# Patient Record
Sex: Male | Born: 1935
Health system: Southern US, Community
[De-identification: ages and names within clinical notes are randomized; demographics above are authoritative.]

## PROBLEM LIST (undated history)

## (undated) DIAGNOSIS — I4891 Unspecified atrial fibrillation: Secondary | ICD-10-CM

## (undated) DIAGNOSIS — I2699 Other pulmonary embolism without acute cor pulmonale: Secondary | ICD-10-CM

## (undated) DIAGNOSIS — J439 Emphysema, unspecified: Secondary | ICD-10-CM

## (undated) DIAGNOSIS — I7101 Dissection of thoracic aorta: Secondary | ICD-10-CM

## (undated) DIAGNOSIS — R911 Solitary pulmonary nodule: Secondary | ICD-10-CM

## (undated) DIAGNOSIS — Z860100 Personal history of colon polyps, unspecified: Secondary | ICD-10-CM

## (undated) DIAGNOSIS — E039 Hypothyroidism, unspecified: Secondary | ICD-10-CM

## (undated) DIAGNOSIS — I71019 Dissection of thoracic aorta, unspecified: Secondary | ICD-10-CM

## (undated) DIAGNOSIS — K2270 Barrett's esophagus without dysplasia: Secondary | ICD-10-CM

## (undated) DIAGNOSIS — I2510 Atherosclerotic heart disease of native coronary artery without angina pectoris: Secondary | ICD-10-CM

## (undated) HISTORY — PX: COLONOSCOPY WITH ESOPHAGOGASTRODUODENOSCOPY (EGD): SHX5779

## (undated) HISTORY — PX: INNER EAR SURGERY: SHX679

## (undated) HISTORY — PX: INGUINAL HERNIA REPAIR: SUR1180

---

## 2008-10-05 HISTORY — PX: LEFT HEART CATH: SHX5946

## 2010-10-05 DIAGNOSIS — I2699 Other pulmonary embolism without acute cor pulmonale: Secondary | ICD-10-CM

## 2010-10-05 HISTORY — DX: Other pulmonary embolism without acute cor pulmonale: I26.99

## 2015-02-15 DIAGNOSIS — Z79899 Other long term (current) drug therapy: Secondary | ICD-10-CM | POA: Diagnosis not present

## 2015-02-15 DIAGNOSIS — Z1389 Encounter for screening for other disorder: Secondary | ICD-10-CM | POA: Diagnosis not present

## 2015-02-15 DIAGNOSIS — E039 Hypothyroidism, unspecified: Secondary | ICD-10-CM | POA: Diagnosis not present

## 2015-02-15 DIAGNOSIS — Z9181 History of falling: Secondary | ICD-10-CM | POA: Diagnosis not present

## 2015-02-15 DIAGNOSIS — E785 Hyperlipidemia, unspecified: Secondary | ICD-10-CM | POA: Diagnosis not present

## 2015-02-15 DIAGNOSIS — I2510 Atherosclerotic heart disease of native coronary artery without angina pectoris: Secondary | ICD-10-CM | POA: Diagnosis not present

## 2015-02-15 DIAGNOSIS — I4891 Unspecified atrial fibrillation: Secondary | ICD-10-CM | POA: Diagnosis not present

## 2015-04-18 DIAGNOSIS — Z6828 Body mass index (BMI) 28.0-28.9, adult: Secondary | ICD-10-CM | POA: Diagnosis not present

## 2015-04-18 DIAGNOSIS — J019 Acute sinusitis, unspecified: Secondary | ICD-10-CM | POA: Diagnosis not present

## 2015-04-18 DIAGNOSIS — J208 Acute bronchitis due to other specified organisms: Secondary | ICD-10-CM | POA: Diagnosis not present

## 2015-05-08 DIAGNOSIS — Z6827 Body mass index (BMI) 27.0-27.9, adult: Secondary | ICD-10-CM | POA: Diagnosis not present

## 2015-05-08 DIAGNOSIS — H1032 Unspecified acute conjunctivitis, left eye: Secondary | ICD-10-CM | POA: Diagnosis not present

## 2015-05-08 DIAGNOSIS — H00015 Hordeolum externum left lower eyelid: Secondary | ICD-10-CM | POA: Diagnosis not present

## 2015-07-01 DIAGNOSIS — I4891 Unspecified atrial fibrillation: Secondary | ICD-10-CM | POA: Diagnosis not present

## 2015-07-01 DIAGNOSIS — Z6827 Body mass index (BMI) 27.0-27.9, adult: Secondary | ICD-10-CM | POA: Diagnosis not present

## 2015-07-01 DIAGNOSIS — N529 Male erectile dysfunction, unspecified: Secondary | ICD-10-CM | POA: Diagnosis not present

## 2015-07-01 DIAGNOSIS — N401 Enlarged prostate with lower urinary tract symptoms: Secondary | ICD-10-CM | POA: Diagnosis not present

## 2015-07-09 DIAGNOSIS — H524 Presbyopia: Secondary | ICD-10-CM | POA: Diagnosis not present

## 2015-07-09 DIAGNOSIS — H2513 Age-related nuclear cataract, bilateral: Secondary | ICD-10-CM | POA: Diagnosis not present

## 2015-07-09 DIAGNOSIS — H5203 Hypermetropia, bilateral: Secondary | ICD-10-CM | POA: Diagnosis not present

## 2015-07-09 DIAGNOSIS — H52223 Regular astigmatism, bilateral: Secondary | ICD-10-CM | POA: Diagnosis not present

## 2015-08-19 DIAGNOSIS — E785 Hyperlipidemia, unspecified: Secondary | ICD-10-CM | POA: Diagnosis not present

## 2015-08-19 DIAGNOSIS — I2510 Atherosclerotic heart disease of native coronary artery without angina pectoris: Secondary | ICD-10-CM | POA: Diagnosis not present

## 2015-08-19 DIAGNOSIS — E039 Hypothyroidism, unspecified: Secondary | ICD-10-CM | POA: Diagnosis not present

## 2015-08-19 DIAGNOSIS — I4891 Unspecified atrial fibrillation: Secondary | ICD-10-CM | POA: Diagnosis not present

## 2015-08-19 DIAGNOSIS — Z23 Encounter for immunization: Secondary | ICD-10-CM | POA: Diagnosis not present

## 2015-08-22 DIAGNOSIS — E785 Hyperlipidemia, unspecified: Secondary | ICD-10-CM | POA: Diagnosis not present

## 2015-08-22 DIAGNOSIS — Z79899 Other long term (current) drug therapy: Secondary | ICD-10-CM | POA: Diagnosis not present

## 2015-08-22 DIAGNOSIS — E039 Hypothyroidism, unspecified: Secondary | ICD-10-CM | POA: Diagnosis not present

## 2015-08-22 DIAGNOSIS — Z125 Encounter for screening for malignant neoplasm of prostate: Secondary | ICD-10-CM | POA: Diagnosis not present

## 2015-10-17 DIAGNOSIS — K219 Gastro-esophageal reflux disease without esophagitis: Secondary | ICD-10-CM | POA: Diagnosis not present

## 2015-11-21 DIAGNOSIS — N410 Acute prostatitis: Secondary | ICD-10-CM | POA: Diagnosis not present

## 2015-11-21 DIAGNOSIS — J441 Chronic obstructive pulmonary disease with (acute) exacerbation: Secondary | ICD-10-CM | POA: Diagnosis not present

## 2015-11-21 DIAGNOSIS — K5792 Diverticulitis of intestine, part unspecified, without perforation or abscess without bleeding: Secondary | ICD-10-CM | POA: Diagnosis not present

## 2015-11-25 DIAGNOSIS — K219 Gastro-esophageal reflux disease without esophagitis: Secondary | ICD-10-CM | POA: Diagnosis not present

## 2015-12-03 DIAGNOSIS — H25813 Combined forms of age-related cataract, bilateral: Secondary | ICD-10-CM | POA: Diagnosis not present

## 2016-01-31 DIAGNOSIS — H40033 Anatomical narrow angle, bilateral: Secondary | ICD-10-CM | POA: Diagnosis not present

## 2016-01-31 DIAGNOSIS — H25811 Combined forms of age-related cataract, right eye: Secondary | ICD-10-CM | POA: Diagnosis not present

## 2016-01-31 DIAGNOSIS — H25812 Combined forms of age-related cataract, left eye: Secondary | ICD-10-CM | POA: Diagnosis not present

## 2016-01-31 DIAGNOSIS — H52223 Regular astigmatism, bilateral: Secondary | ICD-10-CM | POA: Diagnosis not present

## 2016-01-31 DIAGNOSIS — H04123 Dry eye syndrome of bilateral lacrimal glands: Secondary | ICD-10-CM | POA: Diagnosis not present

## 2016-02-19 DIAGNOSIS — E039 Hypothyroidism, unspecified: Secondary | ICD-10-CM | POA: Diagnosis not present

## 2016-02-19 DIAGNOSIS — I4891 Unspecified atrial fibrillation: Secondary | ICD-10-CM | POA: Diagnosis not present

## 2016-02-19 DIAGNOSIS — E785 Hyperlipidemia, unspecified: Secondary | ICD-10-CM | POA: Diagnosis not present

## 2016-02-19 DIAGNOSIS — I2510 Atherosclerotic heart disease of native coronary artery without angina pectoris: Secondary | ICD-10-CM | POA: Diagnosis not present

## 2016-02-19 DIAGNOSIS — Z79899 Other long term (current) drug therapy: Secondary | ICD-10-CM | POA: Diagnosis not present

## 2016-02-19 DIAGNOSIS — Z1389 Encounter for screening for other disorder: Secondary | ICD-10-CM | POA: Diagnosis not present

## 2016-03-03 DIAGNOSIS — Z7982 Long term (current) use of aspirin: Secondary | ICD-10-CM | POA: Diagnosis not present

## 2016-03-03 DIAGNOSIS — H25811 Combined forms of age-related cataract, right eye: Secondary | ICD-10-CM | POA: Diagnosis not present

## 2016-03-03 DIAGNOSIS — G629 Polyneuropathy, unspecified: Secondary | ICD-10-CM | POA: Diagnosis not present

## 2016-03-03 DIAGNOSIS — J449 Chronic obstructive pulmonary disease, unspecified: Secondary | ICD-10-CM | POA: Diagnosis not present

## 2016-03-03 DIAGNOSIS — Z955 Presence of coronary angioplasty implant and graft: Secondary | ICD-10-CM | POA: Diagnosis not present

## 2016-03-03 DIAGNOSIS — I2510 Atherosclerotic heart disease of native coronary artery without angina pectoris: Secondary | ICD-10-CM | POA: Diagnosis not present

## 2016-03-03 DIAGNOSIS — E039 Hypothyroidism, unspecified: Secondary | ICD-10-CM | POA: Diagnosis not present

## 2016-03-03 DIAGNOSIS — K219 Gastro-esophageal reflux disease without esophagitis: Secondary | ICD-10-CM | POA: Diagnosis not present

## 2016-03-03 DIAGNOSIS — Z8679 Personal history of other diseases of the circulatory system: Secondary | ICD-10-CM | POA: Diagnosis not present

## 2016-03-03 DIAGNOSIS — Z87891 Personal history of nicotine dependence: Secondary | ICD-10-CM | POA: Diagnosis not present

## 2016-03-03 DIAGNOSIS — H9190 Unspecified hearing loss, unspecified ear: Secondary | ICD-10-CM | POA: Diagnosis not present

## 2016-03-03 DIAGNOSIS — Z79899 Other long term (current) drug therapy: Secondary | ICD-10-CM | POA: Diagnosis not present

## 2016-03-03 DIAGNOSIS — I739 Peripheral vascular disease, unspecified: Secondary | ICD-10-CM | POA: Diagnosis not present

## 2016-03-03 DIAGNOSIS — N400 Enlarged prostate without lower urinary tract symptoms: Secondary | ICD-10-CM | POA: Diagnosis not present

## 2016-03-03 DIAGNOSIS — I509 Heart failure, unspecified: Secondary | ICD-10-CM | POA: Diagnosis not present

## 2016-04-04 HISTORY — PX: CATARACT EXTRACTION, BILATERAL: SHX1313

## 2016-04-14 DIAGNOSIS — H9193 Unspecified hearing loss, bilateral: Secondary | ICD-10-CM | POA: Diagnosis not present

## 2016-04-14 DIAGNOSIS — J449 Chronic obstructive pulmonary disease, unspecified: Secondary | ICD-10-CM | POA: Diagnosis not present

## 2016-04-14 DIAGNOSIS — Z955 Presence of coronary angioplasty implant and graft: Secondary | ICD-10-CM | POA: Diagnosis not present

## 2016-04-14 DIAGNOSIS — G629 Polyneuropathy, unspecified: Secondary | ICD-10-CM | POA: Diagnosis not present

## 2016-04-14 DIAGNOSIS — N400 Enlarged prostate without lower urinary tract symptoms: Secondary | ICD-10-CM | POA: Diagnosis not present

## 2016-04-14 DIAGNOSIS — Z7982 Long term (current) use of aspirin: Secondary | ICD-10-CM | POA: Diagnosis not present

## 2016-04-14 DIAGNOSIS — I4891 Unspecified atrial fibrillation: Secondary | ICD-10-CM | POA: Diagnosis not present

## 2016-04-14 DIAGNOSIS — I2510 Atherosclerotic heart disease of native coronary artery without angina pectoris: Secondary | ICD-10-CM | POA: Diagnosis not present

## 2016-04-14 DIAGNOSIS — H25812 Combined forms of age-related cataract, left eye: Secondary | ICD-10-CM | POA: Diagnosis not present

## 2016-04-14 DIAGNOSIS — I739 Peripheral vascular disease, unspecified: Secondary | ICD-10-CM | POA: Diagnosis not present

## 2016-04-14 DIAGNOSIS — K219 Gastro-esophageal reflux disease without esophagitis: Secondary | ICD-10-CM | POA: Diagnosis not present

## 2016-04-14 DIAGNOSIS — Z79899 Other long term (current) drug therapy: Secondary | ICD-10-CM | POA: Diagnosis not present

## 2016-04-14 DIAGNOSIS — E039 Hypothyroidism, unspecified: Secondary | ICD-10-CM | POA: Diagnosis not present

## 2016-04-14 DIAGNOSIS — Z87891 Personal history of nicotine dependence: Secondary | ICD-10-CM | POA: Diagnosis not present

## 2016-05-11 DIAGNOSIS — M12811 Other specific arthropathies, not elsewhere classified, right shoulder: Secondary | ICD-10-CM | POA: Diagnosis not present

## 2016-05-21 DIAGNOSIS — M25511 Pain in right shoulder: Secondary | ICD-10-CM | POA: Diagnosis not present

## 2016-05-25 DIAGNOSIS — S46811A Strain of other muscles, fascia and tendons at shoulder and upper arm level, right arm, initial encounter: Secondary | ICD-10-CM | POA: Diagnosis not present

## 2016-05-25 DIAGNOSIS — M75121 Complete rotator cuff tear or rupture of right shoulder, not specified as traumatic: Secondary | ICD-10-CM | POA: Diagnosis not present

## 2016-05-27 DIAGNOSIS — I700 Atherosclerosis of aorta: Secondary | ICD-10-CM | POA: Diagnosis not present

## 2016-05-27 DIAGNOSIS — I4891 Unspecified atrial fibrillation: Secondary | ICD-10-CM | POA: Diagnosis not present

## 2016-05-27 DIAGNOSIS — M75121 Complete rotator cuff tear or rupture of right shoulder, not specified as traumatic: Secondary | ICD-10-CM | POA: Diagnosis not present

## 2016-05-27 DIAGNOSIS — I2510 Atherosclerotic heart disease of native coronary artery without angina pectoris: Secondary | ICD-10-CM | POA: Diagnosis not present

## 2016-05-27 DIAGNOSIS — J60 Coalworker's pneumoconiosis: Secondary | ICD-10-CM | POA: Diagnosis not present

## 2016-05-27 DIAGNOSIS — J449 Chronic obstructive pulmonary disease, unspecified: Secondary | ICD-10-CM | POA: Diagnosis not present

## 2016-06-02 DIAGNOSIS — J449 Chronic obstructive pulmonary disease, unspecified: Secondary | ICD-10-CM | POA: Diagnosis not present

## 2016-06-18 DIAGNOSIS — I2510 Atherosclerotic heart disease of native coronary artery without angina pectoris: Secondary | ICD-10-CM | POA: Diagnosis not present

## 2016-06-18 DIAGNOSIS — I4891 Unspecified atrial fibrillation: Secondary | ICD-10-CM | POA: Diagnosis not present

## 2016-06-18 DIAGNOSIS — Z01810 Encounter for preprocedural cardiovascular examination: Secondary | ICD-10-CM | POA: Diagnosis not present

## 2016-06-23 DIAGNOSIS — I2510 Atherosclerotic heart disease of native coronary artery without angina pectoris: Secondary | ICD-10-CM | POA: Diagnosis not present

## 2016-06-23 DIAGNOSIS — I4891 Unspecified atrial fibrillation: Secondary | ICD-10-CM | POA: Diagnosis not present

## 2016-06-23 DIAGNOSIS — Z01810 Encounter for preprocedural cardiovascular examination: Secondary | ICD-10-CM | POA: Diagnosis not present

## 2016-06-25 DIAGNOSIS — M25511 Pain in right shoulder: Secondary | ICD-10-CM | POA: Diagnosis not present

## 2016-06-25 DIAGNOSIS — M75121 Complete rotator cuff tear or rupture of right shoulder, not specified as traumatic: Secondary | ICD-10-CM | POA: Diagnosis not present

## 2016-06-30 DIAGNOSIS — M75121 Complete rotator cuff tear or rupture of right shoulder, not specified as traumatic: Secondary | ICD-10-CM | POA: Diagnosis not present

## 2016-06-30 DIAGNOSIS — M7551 Bursitis of right shoulder: Secondary | ICD-10-CM | POA: Diagnosis not present

## 2016-06-30 DIAGNOSIS — I4891 Unspecified atrial fibrillation: Secondary | ICD-10-CM | POA: Diagnosis not present

## 2016-06-30 DIAGNOSIS — H9190 Unspecified hearing loss, unspecified ear: Secondary | ICD-10-CM | POA: Diagnosis not present

## 2016-06-30 DIAGNOSIS — I2510 Atherosclerotic heart disease of native coronary artery without angina pectoris: Secondary | ICD-10-CM | POA: Diagnosis not present

## 2016-06-30 DIAGNOSIS — J449 Chronic obstructive pulmonary disease, unspecified: Secondary | ICD-10-CM | POA: Diagnosis not present

## 2016-06-30 DIAGNOSIS — E785 Hyperlipidemia, unspecified: Secondary | ICD-10-CM | POA: Diagnosis not present

## 2016-06-30 DIAGNOSIS — M66812 Spontaneous rupture of other tendons, left shoulder: Secondary | ICD-10-CM | POA: Diagnosis not present

## 2016-06-30 DIAGNOSIS — N400 Enlarged prostate without lower urinary tract symptoms: Secondary | ICD-10-CM | POA: Diagnosis not present

## 2016-06-30 DIAGNOSIS — G8918 Other acute postprocedural pain: Secondary | ICD-10-CM | POA: Diagnosis not present

## 2016-06-30 DIAGNOSIS — I10 Essential (primary) hypertension: Secondary | ICD-10-CM | POA: Diagnosis not present

## 2016-06-30 DIAGNOSIS — M7552 Bursitis of left shoulder: Secondary | ICD-10-CM | POA: Diagnosis not present

## 2016-06-30 DIAGNOSIS — Z87891 Personal history of nicotine dependence: Secondary | ICD-10-CM | POA: Diagnosis not present

## 2016-06-30 DIAGNOSIS — K219 Gastro-esophageal reflux disease without esophagitis: Secondary | ICD-10-CM | POA: Diagnosis not present

## 2016-06-30 DIAGNOSIS — M25511 Pain in right shoulder: Secondary | ICD-10-CM | POA: Diagnosis not present

## 2016-06-30 DIAGNOSIS — Z7982 Long term (current) use of aspirin: Secondary | ICD-10-CM | POA: Diagnosis not present

## 2016-06-30 DIAGNOSIS — Z79899 Other long term (current) drug therapy: Secondary | ICD-10-CM | POA: Diagnosis not present

## 2016-06-30 DIAGNOSIS — J309 Allergic rhinitis, unspecified: Secondary | ICD-10-CM | POA: Diagnosis not present

## 2016-06-30 HISTORY — PX: OTHER SURGICAL HISTORY: SHX169

## 2016-08-12 DIAGNOSIS — M6281 Muscle weakness (generalized): Secondary | ICD-10-CM | POA: Diagnosis not present

## 2016-08-12 DIAGNOSIS — M75121 Complete rotator cuff tear or rupture of right shoulder, not specified as traumatic: Secondary | ICD-10-CM | POA: Diagnosis not present

## 2016-08-12 DIAGNOSIS — M25611 Stiffness of right shoulder, not elsewhere classified: Secondary | ICD-10-CM | POA: Diagnosis not present

## 2016-08-12 DIAGNOSIS — M25511 Pain in right shoulder: Secondary | ICD-10-CM | POA: Diagnosis not present

## 2016-08-18 DIAGNOSIS — M25611 Stiffness of right shoulder, not elsewhere classified: Secondary | ICD-10-CM | POA: Diagnosis not present

## 2016-08-18 DIAGNOSIS — M25511 Pain in right shoulder: Secondary | ICD-10-CM | POA: Diagnosis not present

## 2016-08-18 DIAGNOSIS — M6281 Muscle weakness (generalized): Secondary | ICD-10-CM | POA: Diagnosis not present

## 2016-08-18 DIAGNOSIS — M75121 Complete rotator cuff tear or rupture of right shoulder, not specified as traumatic: Secondary | ICD-10-CM | POA: Diagnosis not present

## 2016-08-20 DIAGNOSIS — M25611 Stiffness of right shoulder, not elsewhere classified: Secondary | ICD-10-CM | POA: Diagnosis not present

## 2016-08-20 DIAGNOSIS — M6281 Muscle weakness (generalized): Secondary | ICD-10-CM | POA: Diagnosis not present

## 2016-08-20 DIAGNOSIS — M25511 Pain in right shoulder: Secondary | ICD-10-CM | POA: Diagnosis not present

## 2016-08-20 DIAGNOSIS — M75121 Complete rotator cuff tear or rupture of right shoulder, not specified as traumatic: Secondary | ICD-10-CM | POA: Diagnosis not present

## 2016-08-25 DIAGNOSIS — M75121 Complete rotator cuff tear or rupture of right shoulder, not specified as traumatic: Secondary | ICD-10-CM | POA: Diagnosis not present

## 2016-08-25 DIAGNOSIS — M25611 Stiffness of right shoulder, not elsewhere classified: Secondary | ICD-10-CM | POA: Diagnosis not present

## 2016-08-25 DIAGNOSIS — M6281 Muscle weakness (generalized): Secondary | ICD-10-CM | POA: Diagnosis not present

## 2016-08-25 DIAGNOSIS — M25511 Pain in right shoulder: Secondary | ICD-10-CM | POA: Diagnosis not present

## 2016-08-27 DIAGNOSIS — IMO0001 Reserved for inherently not codable concepts without codable children: Secondary | ICD-10-CM

## 2016-08-27 DIAGNOSIS — I2609 Other pulmonary embolism with acute cor pulmonale: Secondary | ICD-10-CM | POA: Diagnosis not present

## 2016-08-27 DIAGNOSIS — R911 Solitary pulmonary nodule: Secondary | ICD-10-CM

## 2016-08-27 DIAGNOSIS — R0602 Shortness of breath: Secondary | ICD-10-CM | POA: Diagnosis not present

## 2016-08-27 HISTORY — DX: Solitary pulmonary nodule: R91.1

## 2016-08-27 HISTORY — DX: Reserved for inherently not codable concepts without codable children: IMO0001

## 2016-08-28 ENCOUNTER — Inpatient Hospital Stay (HOSPITAL_COMMUNITY)
Admission: AD | Admit: 2016-08-28 | Discharge: 2016-09-01 | DRG: 175 | Disposition: A | Discharge status: Home / Self Care | Payer: Medicare Other | Source: Other Acute Inpatient Hospital | Attending: Internal Medicine | Admitting: Internal Medicine

## 2016-08-28 ENCOUNTER — Encounter (HOSPITAL_COMMUNITY): Payer: Self-pay | Admitting: Pulmonary Disease

## 2016-08-28 DIAGNOSIS — IMO0001 Reserved for inherently not codable concepts without codable children: Secondary | ICD-10-CM | POA: Insufficient documentation

## 2016-08-28 DIAGNOSIS — Z8249 Family history of ischemic heart disease and other diseases of the circulatory system: Secondary | ICD-10-CM

## 2016-08-28 DIAGNOSIS — I712 Thoracic aortic aneurysm, without rupture: Secondary | ICD-10-CM | POA: Diagnosis present

## 2016-08-28 DIAGNOSIS — I129 Hypertensive chronic kidney disease with stage 1 through stage 4 chronic kidney disease, or unspecified chronic kidney disease: Secondary | ICD-10-CM | POA: Diagnosis not present

## 2016-08-28 DIAGNOSIS — Z86711 Personal history of pulmonary embolism: Secondary | ICD-10-CM | POA: Diagnosis not present

## 2016-08-28 DIAGNOSIS — G4733 Obstructive sleep apnea (adult) (pediatric): Secondary | ICD-10-CM | POA: Diagnosis not present

## 2016-08-28 DIAGNOSIS — N189 Chronic kidney disease, unspecified: Secondary | ICD-10-CM | POA: Diagnosis not present

## 2016-08-28 DIAGNOSIS — Z87891 Personal history of nicotine dependence: Secondary | ICD-10-CM

## 2016-08-28 DIAGNOSIS — I7102 Dissection of abdominal aorta: Secondary | ICD-10-CM | POA: Diagnosis not present

## 2016-08-28 DIAGNOSIS — Z7982 Long term (current) use of aspirin: Secondary | ICD-10-CM

## 2016-08-28 DIAGNOSIS — K2270 Barrett's esophagus without dysplasia: Secondary | ICD-10-CM | POA: Diagnosis not present

## 2016-08-28 DIAGNOSIS — R059 Cough, unspecified: Secondary | ICD-10-CM

## 2016-08-28 DIAGNOSIS — E039 Hypothyroidism, unspecified: Secondary | ICD-10-CM | POA: Diagnosis present

## 2016-08-28 DIAGNOSIS — R0602 Shortness of breath: Secondary | ICD-10-CM | POA: Diagnosis not present

## 2016-08-28 DIAGNOSIS — R05 Cough: Secondary | ICD-10-CM

## 2016-08-28 DIAGNOSIS — R042 Hemoptysis: Secondary | ICD-10-CM | POA: Diagnosis not present

## 2016-08-28 DIAGNOSIS — Z9841 Cataract extraction status, right eye: Secondary | ICD-10-CM | POA: Diagnosis not present

## 2016-08-28 DIAGNOSIS — J439 Emphysema, unspecified: Secondary | ICD-10-CM | POA: Diagnosis present

## 2016-08-28 DIAGNOSIS — I4891 Unspecified atrial fibrillation: Secondary | ICD-10-CM | POA: Diagnosis present

## 2016-08-28 DIAGNOSIS — Z803 Family history of malignant neoplasm of breast: Secondary | ICD-10-CM

## 2016-08-28 DIAGNOSIS — I2510 Atherosclerotic heart disease of native coronary artery without angina pectoris: Secondary | ICD-10-CM | POA: Diagnosis present

## 2016-08-28 DIAGNOSIS — I517 Cardiomegaly: Secondary | ICD-10-CM | POA: Diagnosis present

## 2016-08-28 DIAGNOSIS — R0902 Hypoxemia: Secondary | ICD-10-CM

## 2016-08-28 DIAGNOSIS — I7101 Dissection of thoracic aorta: Secondary | ICD-10-CM | POA: Diagnosis not present

## 2016-08-28 DIAGNOSIS — I2609 Other pulmonary embolism with acute cor pulmonale: Secondary | ICD-10-CM | POA: Diagnosis not present

## 2016-08-28 DIAGNOSIS — Z9842 Cataract extraction status, left eye: Secondary | ICD-10-CM | POA: Diagnosis not present

## 2016-08-28 DIAGNOSIS — R911 Solitary pulmonary nodule: Secondary | ICD-10-CM | POA: Diagnosis not present

## 2016-08-28 DIAGNOSIS — Z833 Family history of diabetes mellitus: Secondary | ICD-10-CM

## 2016-08-28 DIAGNOSIS — R109 Unspecified abdominal pain: Secondary | ICD-10-CM | POA: Diagnosis not present

## 2016-08-28 DIAGNOSIS — I2699 Other pulmonary embolism without acute cor pulmonale: Principal | ICD-10-CM

## 2016-08-28 DIAGNOSIS — Z800 Family history of malignant neoplasm of digestive organs: Secondary | ICD-10-CM

## 2016-08-28 DIAGNOSIS — I2692 Saddle embolus of pulmonary artery without acute cor pulmonale: Secondary | ICD-10-CM | POA: Diagnosis not present

## 2016-08-28 DIAGNOSIS — R0689 Other abnormalities of breathing: Secondary | ICD-10-CM | POA: Diagnosis not present

## 2016-08-28 DIAGNOSIS — I82A11 Acute embolism and thrombosis of right axillary vein: Secondary | ICD-10-CM | POA: Diagnosis not present

## 2016-08-28 DIAGNOSIS — Z86718 Personal history of other venous thrombosis and embolism: Secondary | ICD-10-CM | POA: Diagnosis not present

## 2016-08-28 DIAGNOSIS — I71019 Dissection of thoracic aorta, unspecified: Secondary | ICD-10-CM | POA: Diagnosis present

## 2016-08-28 HISTORY — DX: Personal history of colon polyps, unspecified: Z86.0100

## 2016-08-28 HISTORY — DX: Personal history of colonic polyps: Z86.010

## 2016-08-28 HISTORY — DX: Dissection of thoracic aorta: I71.01

## 2016-08-28 HISTORY — DX: Solitary pulmonary nodule: R91.1

## 2016-08-28 HISTORY — DX: Barrett's esophagus without dysplasia: K22.70

## 2016-08-28 HISTORY — DX: Atherosclerotic heart disease of native coronary artery without angina pectoris: I25.10

## 2016-08-28 HISTORY — DX: Other pulmonary embolism without acute cor pulmonale: I26.99

## 2016-08-28 HISTORY — DX: Unspecified atrial fibrillation: I48.91

## 2016-08-28 HISTORY — DX: Hypothyroidism, unspecified: E03.9

## 2016-08-28 HISTORY — DX: Dissection of thoracic aorta, unspecified: I71.019

## 2016-08-28 HISTORY — DX: Emphysema, unspecified: J43.9

## 2016-08-28 LAB — CBC
HCT: 49.3 % (ref 39.0–52.0)
Hemoglobin: 17.5 g/dL — ABNORMAL HIGH (ref 13.0–17.0)
MCH: 33.2 pg (ref 26.0–34.0)
MCHC: 35.5 g/dL (ref 30.0–36.0)
MCV: 93.5 fL (ref 78.0–100.0)
PLATELETS: 127 10*3/uL — AB (ref 150–400)
RBC: 5.27 MIL/uL (ref 4.22–5.81)
RDW: 12.6 % (ref 11.5–15.5)
WBC: 8.9 10*3/uL (ref 4.0–10.5)

## 2016-08-28 LAB — MAGNESIUM: Magnesium: 2.1 mg/dL (ref 1.7–2.4)

## 2016-08-28 LAB — BASIC METABOLIC PANEL
Anion gap: 10 (ref 5–15)
BUN: 19 mg/dL (ref 6–20)
CO2: 26 mmol/L (ref 22–32)
CREATININE: 1.23 mg/dL (ref 0.61–1.24)
Calcium: 9.7 mg/dL (ref 8.9–10.3)
Chloride: 100 mmol/L — ABNORMAL LOW (ref 101–111)
GFR calc Af Amer: >60 mL/min (ref >60)
GFR, EST NON AFRICAN AMERICAN: 54 mL/min — AB (ref >60)
GLUCOSE: 92 mg/dL (ref 65–99)
Potassium: 4.1 mmol/L (ref 3.5–5.1)
SODIUM: 136 mmol/L (ref 135–145)

## 2016-08-28 LAB — TROPONIN I

## 2016-08-28 LAB — HEPARIN LEVEL (UNFRACTIONATED): Heparin Unfractionated: 0.31 IU/mL (ref 0.30–0.70)

## 2016-08-28 LAB — MRSA PCR SCREENING: MRSA by PCR: NEGATIVE

## 2016-08-28 MED ORDER — SENNOSIDES-DOCUSATE SODIUM 8.6-50 MG PO TABS
1.0000 | ORAL_TABLET | Freq: Every evening | ORAL | Status: DC | PRN
  Administered 2016-08-30 – 2016-08-31 (×2): 1 via ORAL
  Filled 2016-08-28 (×2): qty 1

## 2016-08-28 MED ORDER — SODIUM CHLORIDE 0.9 % IV SOLN: 250.0000 mL | INTRAVENOUS | Status: DC | PRN

## 2016-08-28 MED ORDER — ACETAMINOPHEN 650 MG RE SUPP: 650.0000 mg | Freq: Four times a day (QID) | RECTAL | Status: DC | PRN

## 2016-08-28 MED ORDER — SODIUM CHLORIDE 0.9% FLUSH
3.0000 mL | Freq: Two times a day (BID) | INTRAVENOUS | Status: DC
  Administered 2016-08-28 – 2016-08-29 (×2): 3 mL via INTRAVENOUS

## 2016-08-28 MED ORDER — ONDANSETRON HCL 4 MG PO TABS: 4.0000 mg | ORAL_TABLET | Freq: Four times a day (QID) | ORAL | Status: DC | PRN

## 2016-08-28 MED ORDER — ONDANSETRON HCL 4 MG/2ML IJ SOLN: 4.0000 mg | Freq: Four times a day (QID) | INTRAMUSCULAR | Status: DC | PRN

## 2016-08-28 MED ORDER — HEPARIN (PORCINE) IN NACL 100-0.45 UNIT/ML-% IJ SOLN
1300.0000 [IU]/h | INTRAMUSCULAR | Status: AC
  Administered 2016-08-29 – 2016-08-31 (×3): 1300 [IU]/h via INTRAVENOUS
  Filled 2016-08-28 (×3): qty 250

## 2016-08-28 MED ORDER — ACETAMINOPHEN 325 MG PO TABS
650.0000 mg | ORAL_TABLET | Freq: Four times a day (QID) | ORAL | Status: DC | PRN
  Administered 2016-08-29: 650 mg via ORAL
  Filled 2016-08-28: qty 2

## 2016-08-28 MED ORDER — IPRATROPIUM-ALBUTEROL 0.5-2.5 (3) MG/3ML IN SOLN: 3.0000 mL | Freq: Four times a day (QID) | RESPIRATORY_TRACT | Status: DC | PRN

## 2016-08-28 MED ORDER — HYDROCODONE-ACETAMINOPHEN 5-325 MG PO TABS
1.0000 | ORAL_TABLET | ORAL | Status: DC | PRN
  Administered 2016-08-29 – 2016-08-31 (×8): 1 via ORAL
  Filled 2016-08-28 (×8): qty 1

## 2016-08-28 MED ORDER — SODIUM CHLORIDE 0.9% FLUSH: 3.0000 mL | INTRAVENOUS | Status: DC | PRN

## 2016-08-28 MED ORDER — LEVOTHYROXINE SODIUM 75 MCG PO TABS
75.0000 ug | ORAL_TABLET | Freq: Every day | ORAL | Status: DC
  Administered 2016-08-29 – 2016-09-01 (×4): 75 ug via ORAL
  Filled 2016-08-28 (×4): qty 1

## 2016-08-28 MED ORDER — PANTOPRAZOLE SODIUM 40 MG PO TBEC
40.0000 mg | DELAYED_RELEASE_TABLET | Freq: Every day | ORAL | Status: DC
  Administered 2016-08-28 – 2016-09-01 (×5): 40 mg via ORAL
  Filled 2016-08-28 (×5): qty 1

## 2016-08-28 MED ORDER — METOPROLOL TARTRATE 12.5 MG HALF TABLET
12.5000 mg | ORAL_TABLET | Freq: Two times a day (BID) | ORAL | Status: DC
  Administered 2016-08-28 – 2016-09-01 (×8): 12.5 mg via ORAL
  Filled 2016-08-28 (×8): qty 1

## 2016-08-28 NOTE — H&P (Signed)
History and Physical    Taylor Spencer [id number] DOB: Feb 20, 1935 DOA: 08/28/2016  PCP: Nicoletta Dress, MD  Patient coming from: [facility].   Chief Complaint: SOB  HPI: Taylor Spencer is a 81 y.o. male with medical history significant of Hypothyroidism, HTN, A fib, history of PE 5 years ago, who presents to [facility] complaining of SOB. He relates that he was walking from his car to his apartment and he was very SOB. He got home and call his daughter, he thought he was going to died. He relates SOB just by talking. He denies chest pain. He had Rotator cuff surgery on September 26.   ED Course: Patient was transfer from [facility] for further evaluation of PE. He had A CT angio that showed submassive PE and Right heart strain. Labs from other facility, cr 1.4, hb normal.    Review of Systems: As per HPI otherwise 10 point review of systems negative.    PMH;  CHF Hypothyroidism.  A fib.   PSH;  Rotator cuff sx.   has no tobacco, alcohol, and drug history on file.  No Known Allergies  Family History; mother died MI.    Prior to Admission medications   Not on File    Physical Exam: Vitals:   08/28/16 1723  BP: (!) 122/95  Pulse: (!) 101  Resp: 19  Temp: 98.4 F (36.9 C)  TempSrc: Oral  SpO2: 97%      Constitutional: NAD, calm, comfortable Vitals:   08/28/16 1723  BP: (!) 122/95  Pulse: (!) 101  Resp: 19  Temp: 98.4 F (36.9 C)  TempSrc: Oral  SpO2: 97%   Eyes: PERRL, lids and conjunctivae normal ENMT: Mucous membranes are moist. Posterior pharynx clear of any exudate or lesions.Normal dentition.  Neck: normal, supple, no masses, no thyromegaly Respiratory: clear to auscultation bilaterally, no wheezing, no crackles. Normal respiratory effort. No accessory muscle use.  Cardiovascular: Regular rate and rhythm, no murmurs / rubs / gallops. No extremity edema. 2+ pedal pulses. No carotid bruits.  Abdomen: no tenderness, no  masses palpated. No hepatosplenomegaly. Bowel sounds positive.  Musculoskeletal: no clubbing / cyanosis. No joint deformity upper and lower extremities. Good ROM, no contractures. Normal muscle tone.  Skin: no rashes, lesions, ulcers. No induration Neurologic: CN 2-12 grossly intact. Sensation intact, DTR normal. Strength 5/5 in all 4.  Psychiatric: Normal judgment and insight. Alert and oriented x 3. Normal mood.     Labs on Admission: I have personally reviewed following labs and imaging studies  CBC: No results for input(s): WBC, NEUTROABS, HGB, HCT, MCV, PLT in the last 168 hours. Basic Metabolic Panel: No results for input(s): NA, K, CL, CO2, GLUCOSE, BUN, CREATININE, CALCIUM, MG, PHOS in the last 168 hours. GFR: CrCl cannot be calculated (No order found.). Liver Function Tests: No results for input(s): AST, ALT, ALKPHOS, BILITOT, PROT, ALBUMIN in the last 168 hours. No results for input(s): LIPASE, AMYLASE in the last 168 hours. No results for input(s): AMMONIA in the last 168 hours. Coagulation Profile: No results for input(s): INR, PROTIME in the last 168 hours. Cardiac Enzymes: No results for input(s): CKTOTAL, CKMB, CKMBINDEX, TROPONINI in the last 168 hours. BNP (last 3 results) No results for input(s): PROBNP in the last 8760 hours. HbA1C: No results for input(s): HGBA1C in the last 72 hours. CBG: No results for input(s): GLUCAP in the last 168 hours. Lipid Profile: No results for input(s): CHOL, HDL, LDLCALC, TRIG, CHOLHDL, LDLDIRECT in the last 72  hours. Thyroid Function Tests: No results for input(s): TSH, T4TOTAL, FREET4, T3FREE, THYROIDAB in the last 72 hours. Anemia Panel: No results for input(s): VITAMINB12, FOLATE, FERRITIN, TIBC, IRON, RETICCTPCT in the last 72 hours. Urine analysis: No results found for: COLORURINE, APPEARANCEUR, LABSPEC, PHURINE, GLUCOSEU, HGBUR, BILIRUBINUR, KETONESUR, PROTEINUR, UROBILINOGEN, NITRITE, LEUKOCYTESUR Sepsis Labs:  !!!!!!!!!!!!!!!!!!!!!!!!!!!!!!!!!!!!!!!!!!!! @LABRCNTIP (procalcitonin:4,lacticidven:4) )No results found for this or any previous visit (from the past 240 hour(s)).   Radiological Exams on Admission: No results found.  EKG: Independently reviewed. A fib   Assessment/Plan Active Problems:   Acute pulmonary embolism (HCC)   Atrial fibrillation (HCC)   Hypothyroidism   COPD (chronic obstructive pulmonary disease) (HCC)   Pulmonary embolism (HCC)  1-Bilateral Pulmonary Embolism, submassive with right side heart strain on CT scan;  Admit to step down unit.  Continue with IV heparin, pharmacy to dose.  Cycle cardiac enzymes.  ECHO, Doppler LE ordered.  CCM consulted.   2-A fib; continue with metoprolol, lower dose to avoid hypotension.   3-Hypothyroidism;  Continue with synthroid.   4-COPD; continue with albuterol, ipratroium PRN.     DVT prophylaxis: On heparin Gtt Code Status: Full Code.  Family Communication: care discussed with daughters.  Disposition Plan: home at time of discharge.  Consults called: Pulmonology Admission status: Impatient , step down.    Elmarie Shiley MD [facility] Pager [phone]  If 7PM-7AM, please contact night-coverage [email or web address] Password [email or web address]  08/28/2016, 6:00 PM

## 2016-08-28 NOTE — Progress Notes (Addendum)
[place] for IV heparin Indication: pulmonary embolus  No Known Allergies  Patient Measurements:   Heparin Dosing Weight: 93.6kg  Vital Signs: Temp: 98.4 F (36.9 C) (11/24 1723) Temp Source: Oral (11/24 1723) BP: 122/95 (11/24 1723) Pulse Rate: 101 (11/24 1723)  Labs: No results for input(s): HGB, HCT, PLT, APTT, LABPROT, INR, HEPARINUNFRC, HEPRLOWMOCWT, CREATININE, CKTOTAL, CKMB, TROPONINI in the last 72 hours.  CrCl cannot be calculated (No order found.).   Assessment: 52 YOM transferred from [facility] with submassive PE after having rotator cuff surgery 6 weeks ago. Patient has history of AFib- he tells me he was on warfarin ~5 years ago, but was taken off of it after he had nosebleeds. Daughter in the room informed me his heparin was held this morning due to elevated level and then restarted- confirmed with RN the bag currently is from [place]. APTT early this morning was 88.9 seconds, then a recheck ~noon was 43.8. Hgb on admission was 17.4, plts 130, SCr 1.4.  Goal of Therapy:  Heparin level 0.3-0.7 units/ml Monitor platelets by anticoagulation protocol: Yes   Plan:  -continue heparin at 1200 units/hr -stat heparin level to confirm concentration and level are appropriate per our protocol -daily heparin level and CBC  Shavonte Zhao D. Lakoda Mcanany, PharmD, BCPS Clinical Pharmacist Pager: [phone] 08/28/2016 6:27 PM   ADDENDUM Stat heparin level returned in range at 0.31 units/mL. Hgb 17.5, plts 127- no bleeding noted  Plan: -increase heparin slightly to 1300 units/hr to ensure level stays therapeutic -daily heparin level and CBC  Herminio Kniskern D. Ameyah Bangura, PharmD, BCPS Clinical Pharmacist Pager: [phone] 08/28/2016 7:43 PM

## 2016-08-28 NOTE — Progress Notes (Signed)
Called from [facility] with regards to patient being transferred to [facility]. Initially discuss patient care with Dr. Maxwell Caul. Taylor Spencer is a 81 year old male with pmh CAD, HTN, A. fib, CKD; who presented to [facility] with complaints of shortness of breath.  Vital signs HR 70- 90s, respirations [id number], systolic BPs [id number], O2 saturations 93-95% on room air. Patient noted to have Patient found on CT to have bilateral pulmonary embolus with signs of right heart strain on imaging. Heparin drip was started. Transport was recommended for need of consultative services. Initially, PCCM was called and stated that the patient appeared stable and could remain at [facility] receiving treatment and did not need to be transferred to [facility].  Subsequently, called by the hospitalist at [facility] who reported that the patient was critically ill. PCCM  was then recalled and noted that they would see the patient if admitted to the hospitalist service. Recommended patient be transferred to a stepdown bed here at [facility] for possible need of consultative services.

## 2016-08-28 NOTE — Consult Note (Signed)
Name: Taylor Spencer MRN: [id number] DOB: [birth year]    ADMISSION DATE:  08/28/2016 CONSULTATION DATE:  08/28/2016  REFERRING MD :  Niel Hummer, M.D. / [facility]  CHIEF COMPLAINT:  Submassive PE  BRIEF PATIENT DESCRIPTION: 81 y.o. male transferred from outside hospital this evening with history of pulmonary embolism and proximally 5 years ago. Patient had sudden onset of dyspnea earlier today. Patient underwent imaging with CT angiogram showing submassive pulmonary embolism and findings concerning for right heart strain. Patient transferred to our facility for pulmonary evaluation for possible thrombolytic or catheter directed thrombolytic therapy. Documentation shows lowest saturation recorded of 90% presumably on room air at 7:38 PM on 11/23. Patient has no recorded hypotension in notes sent. The patient himself reports that he was at his daughter's yesterday for the holiday and noted at approximately 3 PM after returning from the bathroom that he was more dyspneic than normal. His daughter reports that over the last 2-3 weeks he has been noticeably more short of breath with exertion. The patient reports that after driving a short distance to his apartment and after walking 150 feet to his front door he had severe dyspnea and feeling as though he was "hyperventilating". He denies any associated chest pressure, pain, or palpitations. He denies any syncope or near syncope. The patient then contacted his family and went to the local emergency room for evaluation. The patient reports he does have occasional edema in his legs which is attributed to his varicose veins but denies any consistent lower extremity edema or pain. He denies any edema in his right arm but does note that his arm was immobilized for a period of time in a sling postoperatively in September. He denies any subjective fever, chills, or sweats. He denies any abdominal pain, nausea, or vomiting. He denies any rashes or abnormal  bruising. Of note the patient has taken multiple trips recently but has not been in a car for prolonged periods of time without ambulation. The patient does have a history of pulmonary embolism approximately 5 years ago which was treated for 1-2 weeks for systemic anticoagulation and then stop secondary to epistaxis without further treatment per the patient.  SIGNIFICANT EVENTS  11/24 - Transfer from [facility] w/ submassive PE  STUDIES:  CTA CHEST 11/24 [name]): Per radiology report. Prominent filling defects in the right and left main pulmonary arteries with extension into bilateral upper and lower lobe branch vessels. RV/LV ratio 1.6 suggesting evidence of right heart strain. No pericardial effusion. No evidence of aortic aneurysm. Eccentric mural thrombus in thoracic aorta extending from posterior arch throughout the descending aorta and into the abdominal hiatus. This is consistent with a chronic type B dissection. No change in appearance since previous study. No pathologic mediastinal adenopathy. Small esophageal hiatal hernia. Enlarged thyroid gland. Diffuse emphysematous changes.focal pulmonary nodule in right upper lobe measuring 7 mm. This is not reportedly change since study in 12/06/12. No pleural effusion or pneumothorax. Notably enlarged left thyroid lobe without change.  EKG 11/24:  Personally reviewed by me. Atrial fibrillation with QTc 437 ms and no suggestion of right heart strain.  OUTSIDE LABS (08/27/16): CBC: 8.9/17.4/30.5/1:30 BMP: 138/4.3/101/26/24/1.40/89/10.1 LFT: 4.2/7.5/0.5/73/21/36 INR: 1.0 PTT: 27.9 Troponin I:  <0.01 (repeat troponin I the same) ProBNP: 591  (0 - 1800) ABG (on room air): 7.43/32/68  HISTORY OF PRESENT ILLNESS:  81 y.o. male with history of pulmonary embolism approximately 5 years ago. He presented with acute onset of dyspnea today. Electronic medical record reviewed and documentation of  submassive pulmonary embolism with findings concerning for right  heart strain from outside hospital were reported to accepting physician. Heparin drip was initiated at outside hospital. Patient's heart rate reported from outside hospital was 90-70 bpm, respirations 20-18 per minute, systolic blood pressure Q000111Q, & saturation 95-93 percent on room air.  PAST MEDICAL HISTORY :  Past Medical History:  Diagnosis Date  . Atrial fibrillation ([place])    not on anticoagulation  . Barrett's esophagus   . Chronic thoracic aortic dissection (HCC)    type B noted on CTA chest  . Coronary artery disease    stent in 2010  . Emphysema of lung ([place])    seen on CT imaging  . History of colon polyps   . Hypothyroid   . Lung nodule < 6cm on CT 08/27/2016   50mm right upper lobe without change compared with previous CT 12/06/12 per radiology  . Pulmonary embolism ([place]) 2012   Treated w/ 1-2 weeks of anticoagulation & stopped due to epistaxis. No clear cause.    PAST SURGICAL HISTORY: Past Surgical History:  Procedure Laterality Date  . CATARACT EXTRACTION, BILATERAL  04/2016  . COLONOSCOPY WITH ESOPHAGOGASTRODUODENOSCOPY (EGD)     last colonoscopy in 2016?  . INGUINAL HERNIA REPAIR Bilateral   . INNER EAR SURGERY Left   . LEFT HEART CATH  2010   Stent Placed x1  . rotator cuff Right 06/30/2016    Prior to Admission medications   Medication Sig Start Date End Date Taking? Authorizing Provider  albuterol (PROVENTIL) (2.5 MG/3ML) 0.083% nebulizer solution Take 2.5 mg by nebulization every 6 (six) hours as needed for wheezing or shortness of breath.   Yes Historical Provider, MD  aspirin EC 81 MG tablet Take 81 mg by mouth daily.   Yes Historical Provider, MD  furosemide (LASIX) 80 MG tablet Take 80 mg by mouth daily as needed for edema.   Yes Historical Provider, MD  GINSENG EXTRACT PO Take by mouth.   Yes Historical Provider, MD  levothyroxine (SYNTHROID, LEVOTHROID) 75 MCG tablet Take 75 mcg by mouth daily before breakfast.   Yes Historical Provider, MD    metoprolol tartrate (LOPRESSOR) 25 MG tablet Take 25 mg by mouth 2 (two) times daily.   Yes Historical Provider, MD  Omega-3 Fatty Acids (FISH OIL) 1000 MG CAPS Take 1 capsule by mouth.   Yes Historical Provider, MD  omeprazole (PRILOSEC) 20 MG capsule Take 20 mg by mouth daily.   Yes Historical Provider, MD  OVER THE COUNTER MEDICATION Take 1 tablet by mouth daily.   Yes Historical Provider, MD  OVER THE COUNTER MEDICATION Take 15 mLs by mouth daily.   Yes Historical Provider, MD  vitamin B-12 (CYANOCOBALAMIN) 250 MCG tablet Take 250 mcg by mouth daily.   Yes Historical Provider, MD   No Known Allergies  FAMILY HISTORY:  Family History  Problem Relation Age of Onset  . Heart attack Mother 57  . Colon cancer Mother   . Breast cancer Mother   . Diabetes Mother   . Heart attack Father 48  . Liver cancer Brother   . Breast cancer Daughter   . Breast cancer Daughter   . Breast cancer Grandchild   . Rheumatologic disease Neg Hx   . Clotting disorder Neg Hx     SOCIAL HISTORY: Social History   Social History  . Marital status: Widowed    Spouse name: N/A  . Number of children: N/A  . Years of education: N/A  Social History Main Topics  . Smoking status: Former Research scientist (life sciences)  . Smokeless tobacco: None  . Alcohol use No  . Drug use: No  . Sexual activity: Not Asked   Other Topics Concern  . None   Social History Narrative   [facility]:    Patient is originally from [place]. Previously worked in the [employer] for approximately 26 years. 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 Prior to his shoulder surgery and cataract surgery was enjoying golfing and retirement with his children locally. Patient has made multiple car trips within a fairly recent time. Approximately 2 weeks ago he did drive to [facility] to a funeral for approximately 4 hours trip duration but did stop  during this travel. Approximately 6 weeks ago he did take a driving trip to [facility] which lasted approximately 3.5-4 hours during which time he did stop. In August he took a trip to [facility] for approximately 3 hours again taking a stop. He is made to separate trips within that time to [place] approximately 5 hours in duration each reporting that he did take stops during this time as well.    REVIEW OF SYSTEMS:  No lymphadenopathy in his neck, groin, or axilla. No headache or vision changes. A pertinent 14 point review of systems is negative except as per the history of presenting illness.  SUBJECTIVE: As above.  VITAL SIGNS: Temp:  [98.4 F (36.9 C)] 98.4 F (36.9 C) (11/24 1723) Pulse Rate:  [101] 101 (11/24 1723) Resp:  [19] 19 (11/24 1723) BP: (122)/(95) 122/95 (11/24 1723) SpO2:  [97 %] 97 % (11/24 1723)  PHYSICAL EXAMINATION: General:  Awake. Alert. No acute distress. Sitting watching TV. Daughters at bedside.  Integument:  Warm & dry. No rash on exposed skin.  Lymphatics:  No appreciated cervical or supraclavicular lymphadenoapthy. HEENT:  Moist mucus membranes. No oral ulcers. No scleral injection or icterus.  Cardiovascular:  Regular rate. No edema in lower or right upper extremities. Irregular rhythm with atrial fibrillation noted on telemetry. Varicose veins in lower extremities noted. Pulmonary:  Good aeration & clear to auscultation bilaterally. Symmetric chest wall expansion. No accessory muscle use on room air with saturation 95%. Speaking in complete sentences. Abdomen: Soft. Normal bowel sounds. Nondistended. Grossly nontender. Musculoskeletal:  Normal bulk and tone. Hand grip strength 5/5 bilaterally. No joint deformity or effusion appreciated. Patient unable to lift his right arm. Neurological:  CN 2-12 grossly in tact. No meningismus. Moving all 4 extremities equally.  Psychiatric:  Mood and affect congruent. Speech normal rhythm, rate & tone.      Recent Labs Lab 08/28/16 1809  NA 136  K 4.1  CL 100*  CO2 26  BUN 19  CREATININE 1.23  GLUCOSE 92    Recent Labs Lab 08/28/16 1809  HGB 17.5*  HCT 49.3  WBC 8.9  PLT 127*   No results found.  ASSESSMENT / PLAN:  81 y.o. male withPrior history of tobacco use as well as a history of a pulmonary embolus approximately 5 years ago treated for only approximately 2 weeks with systemic anticoagulation. Patient's symptoms would suggest his initial clot possibly occurred as far back as 2 weeks. Certainly his recent travels reasonable possibility of a lower extremity clot but given his right arm immobilization and limited use a right upper extremity clot is also certainly possible. At this time given lack of hypotension,  lack of oxygen requirement, and other medical problems including aortic dissection that is chronic, and absence of syncope or symptomatic right heart strain with normal cardiac biomarkers I do not feel that IV or catheter directed thrombolytic therapy is necessary or safe. Certainly if the patient's clinical status deteriorates further questions can be reconsidered. With the patient's underlying emphysema is conceivable that his lung disease does put him at somewhat of a disadvantage with his clot burden.  1. Submassive Pulmonary Embolism:  Agree with the stomach anticoagulation with heparin infusion per pharmacy protocol. Patient cautioned against the risks of internal and on seeing bleeding. Given mild renal dysfunction would consider Coumadin versus Eliquis. Agree with lower extremity venous duplex ultrasounds to assess for clot and I have also ordered a right upper extremity venous duplex. Patient will need lifelong anticoagulation unless reasonable certainty is present for clot came from right shoulder/arm immobilization. Recommend ambulatory O2 saturation prior to discharge from hospital. 2. H/O DVT/PE: Unclear etiology. Does not appear to have been treated with a course  of systemic anticoagulation. 3. Emphysema: Likely due to prior tobacco use. Patient will need to be screening for alpha-1 antitrypsin deficiency after discharge from hospital. Patient also will need full pulmonary testing after discharge from hospital. 4. Type B Aortic Dissection: Reportedly chronic per radiology report. No symptoms to suggest acute dissection. Recommend consideration of evaluation by thoracic versus vascular surgery once images are available. 5. Right Upper Lobe Nodule: Measured at 7 mm per radiology report. Reportedly stable going back to 2014 suggesting benign etiology & no further imaging/testing needed. Will need to confirm this with recent and previous CT imaging. 6. Follow-up: I have given the patient my contact information. Patient should be scheduled an appointment in my office after discharge from hospital.  Remainder of care as per primary service.  PCCM will be available on an as-needed basis.  Sonia Baller Ashok Cordia, M.D. [facility] Pager:  [phone] After 3pm or if no response, call [phone] 08/28/2016, 7:41 PM

## 2016-08-29 ENCOUNTER — Inpatient Hospital Stay (HOSPITAL_COMMUNITY): Payer: Medicare Other

## 2016-08-29 DIAGNOSIS — I4891 Unspecified atrial fibrillation: Secondary | ICD-10-CM

## 2016-08-29 DIAGNOSIS — I7102 Dissection of abdominal aorta: Secondary | ICD-10-CM

## 2016-08-29 DIAGNOSIS — I2699 Other pulmonary embolism without acute cor pulmonale: Secondary | ICD-10-CM

## 2016-08-29 LAB — HEPARIN LEVEL (UNFRACTIONATED): Heparin Unfractionated: 0.5 IU/mL (ref 0.30–0.70)

## 2016-08-29 LAB — COMPREHENSIVE METABOLIC PANEL
ALBUMIN: 3.4 g/dL — AB (ref 3.5–5.0)
ALT: 15 U/L — ABNORMAL LOW (ref 17–63)
AST: 17 U/L (ref 15–41)
Alkaline Phosphatase: 59 U/L (ref 38–126)
Anion gap: 9 (ref 5–15)
BILIRUBIN TOTAL: 0.7 mg/dL (ref 0.3–1.2)
BUN: 19 mg/dL (ref 6–20)
CO2: 25 mmol/L (ref 22–32)
Calcium: 9.3 mg/dL (ref 8.9–10.3)
Chloride: 102 mmol/L (ref 101–111)
Creatinine, Ser: 1.36 mg/dL — ABNORMAL HIGH (ref 0.61–1.24)
GFR calc Af Amer: 55 mL/min — ABNORMAL LOW (ref >60)
GFR calc non Af Amer: 48 mL/min — ABNORMAL LOW (ref >60)
GLUCOSE: 93 mg/dL (ref 65–99)
POTASSIUM: 4.2 mmol/L (ref 3.5–5.1)
Sodium: 136 mmol/L (ref 135–145)
TOTAL PROTEIN: 6.4 g/dL — AB (ref 6.5–8.1)

## 2016-08-29 LAB — ECHOCARDIOGRAM COMPLETE: Weight: 3164.8 oz

## 2016-08-29 LAB — CBC
HEMATOCRIT: 47.3 % (ref 39.0–52.0)
Hemoglobin: 16.4 g/dL (ref 13.0–17.0)
MCH: 32.3 pg (ref 26.0–34.0)
MCHC: 34.7 g/dL (ref 30.0–36.0)
MCV: 93.1 fL (ref 78.0–100.0)
PLATELETS: 128 10*3/uL — AB (ref 150–400)
RBC: 5.08 MIL/uL (ref 4.22–5.81)
RDW: 12.6 % (ref 11.5–15.5)
WBC: 10.8 10*3/uL — ABNORMAL HIGH (ref 4.0–10.5)

## 2016-08-29 LAB — TROPONIN I: Troponin I: <0.03 ng/mL (ref <0.03)

## 2016-08-29 NOTE — Consult Note (Signed)
[address]       [place] [address]             [phone]      Cardiothoracic Surgery Consultation  Reason for Consult: chronic type B aortic dissection Referring Physician: Dr. Helene Shoe is an 81 y.o. male.  HPI:   The patient is a 81 year old gentleman with a past history of pulmonary emboli treated with coumadin, coronary disease s/p stent in 2010, atrial fibrillation, and HTN who had right shoulder surgery in September which he has been trying to recover from. He was admitted yesterday after developing severe SOB while walking from his vehicle to his apartment. He could barely talk and felt like he was going to die. A CT at [facility] showed submassive PE with right heart strain. It also showed a chronic type B aortic dissection with no aneurysm which was unchanged compared to a CTA in 2015. He was transferred here for further care. He had been on coumadin after his prior PE 5 yrs ago but stopped it on his own. His main complaint now is right lower chest pain made worse with deep breaths that just started this morning.  He denies any history of aortic dissection and neither he nor son report knowing about a prior dissection.  Past Medical History:  Diagnosis Date  . Atrial fibrillation ([place])    not on anticoagulation  . Barrett's esophagus   . Chronic thoracic aortic dissection (HCC)    type B noted on CTA chest  . Coronary artery disease    stent in 2010  . Emphysema of lung ([place])    seen on CT imaging  . History of colon polyps   . Hypothyroid   . Lung nodule < 6cm on CT 08/27/2016   33m right upper lobe without change compared with previous CT 12/06/12 per radiology  . [place]   Treated w/ 1-2 weeks of anticoagulation & stopped due to epistaxis. No clear cause.    Past Surgical History:  Procedure Laterality Date  . CATARACT EXTRACTION, BILATERAL  04/2016  . COLONOSCOPY WITH ESOPHAGOGASTRODUODENOSCOPY (EGD)      last colonoscopy in 2016?  . INGUINAL HERNIA REPAIR Bilateral   . INNER EAR SURGERY Left   . LEFT HEART CATH  2010   Stent Placed x1  . rotator cuff Right 06/30/2016    Family History  Problem Relation Age of Onset  . Heart attack Mother 828 . Colon cancer Mother   . Breast cancer Mother   . Diabetes Mother   . Heart attack Father 774 . Liver cancer Brother   . Breast cancer Daughter   . Breast cancer Daughter   . Breast cancer Grandchild   . Rheumatologic disease Neg Hx   . Clotting disorder Neg Hx     Social History:  reports that he has quit smoking. He does not have any smokeless tobacco history on file. He reports that he does not drink alcohol or use drugs.  Allergies: No Known Allergies  Medications:  I have reviewed the patient's current medications. Prior to Admission:  Prescriptions Prior to Admission  Medication Sig Dispense Refill Last Dose  . albuterol (PROVENTIL) (2.5 MG/3ML) 0.083% nebulizer solution Take 2.5 mg by nebulization every 6 (six) hours as needed for wheezing or shortness of breath.   Past Month at Unknown time  . aspirin EC 81 MG tablet Take 81 mg by  mouth daily.   08/27/2016 at Unknown time  . furosemide (LASIX) 80 MG tablet Take 80 mg by mouth daily as needed for edema.   Past Month at Unknown time  . GINSENG EXTRACT PO Take by mouth.   08/27/2016 at Unknown time  . levothyroxine (SYNTHROID, LEVOTHROID) 75 MCG tablet Take 75 mcg by mouth daily before breakfast.   08/27/2016 at Unknown time  . metoprolol tartrate (LOPRESSOR) 25 MG tablet Take 25 mg by mouth 2 (two) times daily.   08/27/2016 at 0800  . Omega-3 Fatty Acids (FISH OIL) 1000 MG CAPS Take 1 capsule by mouth.   08/27/2016 at Unknown time  . omeprazole (PRILOSEC) 20 MG capsule Take 20 mg by mouth daily.   08/27/2016 at Unknown time  . OVER THE COUNTER MEDICATION Take 1 tablet by mouth daily.   08/27/2016 at Unknown time  . OVER THE COUNTER MEDICATION Take 15 mLs by mouth daily.    08/27/2016 at Unknown time  . vitamin B-12 (CYANOCOBALAMIN) 250 MCG tablet Take 250 mcg by mouth daily.   Past Month at Unknown time   Scheduled: . levothyroxine  75 mcg Oral QAC breakfast  . metoprolol tartrate  12.5 mg Oral BID  . pantoprazole  40 mg Oral Daily  . sodium chloride flush  3 mL Intravenous Q12H   Continuous: . heparin 1,300 Units/hr (08/29/16 1104)   QQV:ZDGLOV chloride, acetaminophen **OR** acetaminophen, HYDROcodone-acetaminophen, ipratropium-albuterol, ondansetron **OR** ondansetron (ZOFRAN) IV, senna-docusate, sodium chloride flush Anti-infectives    None      Results for orders placed or performed during the hospital encounter of 08/28/16 (from the past 48 hour(s))  MRSA PCR Screening     Status: None   Collection Time: 08/28/16  5:20 PM  Result Value Ref Range   MRSA by PCR NEGATIVE NEGATIVE    Comment:        The GeneXpert MRSA Assay (FDA approved for NASAL specimens only), is one component of a comprehensive MRSA colonization surveillance program. It is not intended to diagnose MRSA infection nor to guide or monitor treatment for MRSA infections.   CBC     Status: Abnormal   Collection Time: 08/28/16  6:09 PM  Result Value Ref Range   WBC 8.9 4.0 - 10.5 K/uL   RBC 5.27 4.22 - 5.81 MIL/uL   Hemoglobin 17.5 (H) 13.0 - 17.0 g/dL   HCT 49.3 39.0 - 52.0 %   MCV 93.5 78.0 - 100.0 fL   MCH 33.2 26.0 - 34.0 pg   MCHC 35.5 30.0 - 36.0 g/dL   RDW 12.6 11.5 - 15.5 %   Platelets 127 (L) 150 - 400 K/uL  Basic metabolic panel     Status: Abnormal   Collection Time: 08/28/16  6:09 PM  Result Value Ref Range   Sodium 136 135 - 145 mmol/L   Potassium 4.1 3.5 - 5.1 mmol/L   Chloride 100 (L) 101 - 111 mmol/L   CO2 26 22 - 32 mmol/L   Glucose, Bld 92 65 - 99 mg/dL   BUN 19 6 - 20 mg/dL   Creatinine, Ser 1.23 0.61 - 1.24 mg/dL   Calcium 9.7 8.9 - 10.3 mg/dL   GFR calc non Af Amer 54 (L) >60 mL/min   GFR calc Af Amer >60 >60 mL/min    Comment: (NOTE) The  eGFR has been calculated using the CKD EPI equation. This calculation has not been validated in all clinical situations. eGFR's persistently <60 mL/min signify possible Chronic Kidney Disease.  Anion gap 10 5 - 15  Troponin I (q 6hr x 3)     Status: None   Collection Time: 08/28/16  6:09 PM  Result Value Ref Range   Troponin I <0.03 <0.03 ng/mL  Magnesium     Status: None   Collection Time: 08/28/16  6:09 PM  Result Value Ref Range   Magnesium 2.1 1.7 - 2.4 mg/dL  Heparin level (unfractionated)     Status: None   Collection Time: 08/28/16  6:31 PM  Result Value Ref Range   Heparin Unfractionated 0.31 0.30 - 0.70 IU/mL    Comment:        IF HEPARIN RESULTS ARE BELOW EXPECTED VALUES, AND PATIENT DOSAGE HAS BEEN CONFIRMED, SUGGEST FOLLOW UP TESTING OF ANTITHROMBIN III LEVELS.   Troponin I (q 6hr x 3)     Status: None   Collection Time: 08/28/16 11:35 PM  Result Value Ref Range   Troponin I <0.03 <0.03 ng/mL  Heparin level (unfractionated)     Status: None   Collection Time: 08/29/16  5:05 AM  Result Value Ref Range   Heparin Unfractionated 0.50 0.30 - 0.70 IU/mL    Comment:        IF HEPARIN RESULTS ARE BELOW EXPECTED VALUES, AND PATIENT DOSAGE HAS BEEN CONFIRMED, SUGGEST FOLLOW UP TESTING OF ANTITHROMBIN III LEVELS.   CBC     Status: Abnormal   Collection Time: 08/29/16  5:05 AM  Result Value Ref Range   WBC 10.8 (H) 4.0 - 10.5 K/uL   RBC 5.08 4.22 - 5.81 MIL/uL   Hemoglobin 16.4 13.0 - 17.0 g/dL   HCT 47.3 39.0 - 52.0 %   MCV 93.1 78.0 - 100.0 fL   MCH 32.3 26.0 - 34.0 pg   MCHC 34.7 30.0 - 36.0 g/dL   RDW 12.6 11.5 - 15.5 %   Platelets 128 (L) 150 - 400 K/uL  Troponin I (q 6hr x 3)     Status: None   Collection Time: 08/29/16  5:06 AM  Result Value Ref Range   Troponin I <0.03 <0.03 ng/mL  Comprehensive metabolic panel     Status: Abnormal   Collection Time: 08/29/16  5:06 AM  Result Value Ref Range   Sodium 136 135 - 145 mmol/L   Potassium 4.2 3.5 - 5.1  mmol/L   Chloride 102 101 - 111 mmol/L   CO2 25 22 - 32 mmol/L   Glucose, Bld 93 65 - 99 mg/dL   BUN 19 6 - 20 mg/dL   Creatinine, Ser 1.36 (H) 0.61 - 1.24 mg/dL   Calcium 9.3 8.9 - 10.3 mg/dL   Total Protein 6.4 (L) 6.5 - 8.1 g/dL   Albumin 3.4 (L) 3.5 - 5.0 g/dL   AST 17 15 - 41 U/L   ALT 15 (L) 17 - 63 U/L   Alkaline Phosphatase 59 38 - 126 U/L   Total Bilirubin 0.7 0.3 - 1.2 mg/dL   GFR calc non Af Amer 48 (L) >60 mL/min   GFR calc Af Amer 55 (L) >60 mL/min    Comment: (NOTE) The eGFR has been calculated using the CKD EPI equation. This calculation has not been validated in all clinical situations. eGFR's persistently <60 mL/min signify possible Chronic Kidney Disease.    Anion gap 9 5 - 15    No results found.  Review of Systems  Constitutional: Negative for chills, fever and malaise/fatigue.  HENT: Negative.   Eyes: Negative.   Respiratory: Positive for cough and shortness  of breath. Negative for hemoptysis.   Cardiovascular: Positive for chest pain and orthopnea. Negative for palpitations, claudication, leg swelling and PND.  Gastrointestinal: Negative.   Genitourinary: Negative.   Musculoskeletal:       Right shoulder pain since surgical repair.  Skin: Negative.   Neurological: Negative.   Endo/Heme/Allergies: Negative.   Psychiatric/Behavioral: Negative.    Blood pressure 103/82, pulse 93, temperature 98 F (36.7 C), temperature source Oral, resp. rate 17, weight 89.7 kg (197 lb 12.8 oz), SpO2 96 %. Physical Exam  Constitutional: He is oriented to person, place, and time. He appears well-developed and well-nourished. No distress.  HENT:  Head: Normocephalic and atraumatic.  Mouth/Throat: Oropharynx is clear and moist.  Eyes: EOM are normal. Pupils are equal, round, and reactive to light.  Neck: Normal range of motion. Neck supple. No JVD present. No thyromegaly present.  Cardiovascular: Normal rate, regular rhythm, normal heart sounds and intact distal  pulses.   No murmur heard. Respiratory: Effort normal and breath sounds normal. No respiratory distress. He has no rales. He exhibits no tenderness.  GI: Soft. Bowel sounds are normal. He exhibits no distension and no mass. There is no tenderness.  Musculoskeletal: He exhibits no edema.  Lymphadenopathy:    He has no cervical adenopathy.  Neurological: He is alert and oriented to person, place, and time.  Skin: Skin is warm and dry.   CTA scan from [facility] yesterday was personally reviewed and interpreted on PACS and compared to his old scans at [facility] from 2015, 2013 and 2009 that are also on PACS. He has a chronic type B aortic dissection dating back to 2009 that has changed some since 2009 with further distal extension and enlargement of the thrombosed false lumen but is completely unchanged since 2015.  Assessment/Plan:  He has a chronic type B aortic dissection that is stable since 2015 and the aorta is not aneurysmal. There is no treatment or follow up needed in this 81 year old gentleman. There is no contraindication to anticoagulation and I would think that chronic anticoagulation for recurrent PE is indicated. I suspect that his right chest pain is pleuritic and related to pulmonary infarct and inflammation associated with that.  I spent 60 minutes performing this consultation and > 50% of this time was spent face to face counseling and coordinating the care of this patient's chronic type B aortic dissection.  Gaye Pollack 08/29/2016, 2:17 PM

## 2016-08-29 NOTE — Progress Notes (Signed)
PROGRESS NOTE    [place]  [id number] DOB: [birth year] DOA: 08/28/2016 PCP: Nicoletta Dress, MD    Brief Narrative:  [place] is a 81 y.o. male with medical history significant of Hypothyroidism, HTN, A fib, history of PE 5 years ago, who presents to [facility] complaining of SOB. He relates that he was walking from his car to his apartment and he was very SOB. He got home and call his daughter, he thought he was going to died. He relates SOB just by talking. He denies chest pain. He had Rotator cuff surgery on September 26.   ED Course: Patient was transfer from [facility] for further evaluation of PE. He had A CT angio that showed submassive PE and Right heart strain. Labs from other facility, cr 1.4, hb normal.    Assessment & Plan:   Active Problems:   Acute pulmonary embolism (HCC)   Atrial fibrillation (HCC)   Hypothyroidism   Pulmonary embolism (HCC)   Chronic thoracic aortic dissection (HCC)   1--Bilateral Pulmonary Embolism, submassive with right side heart strain on CT scan;  Continue with heparin Gtt for total 48 to 72 hour.  Patient will like transition to Eliquis when appropriate.  ECHO and doppler pending.  Appreciate Dr Meda Coffee help./   2-A fib; continue with metoprolol, lower dose to avoid hypotension.   3-Hypothyroidism;  Continue with synthroid.   4-COPD; continue with albuterol, ipratroium PRN.   5-Chronic Type B aortic dissection;  CVTS consulted.     DVT prophylaxis: heparin Gtt  Code Status: Full code.  Family Communication: care discussed with Son who was at bedside.  Disposition Plan; remain inpatient.    Consultants:   Pulmonary   CVTS   Procedures:   ECHO pending.   Doppler.    Antimicrobials:  none  Subjective: He is feeling ok, denies worsening dyspnea.   Objective: Vitals:   08/29/16 0735 08/29/16 0813 08/29/16 1045 08/29/16 1230  BP: 111/84  103/82 107/80  Pulse: (!) 102  93 91    Resp: 14  17 16   Temp:  98 F (36.7 C)  97.9 F (36.6 C)  TempSrc:  Oral  Oral  SpO2: 96%  96% 99%  Weight:        Intake/Output Summary (Last 24 hours) at 08/29/16 1519 Last data filed at 08/29/16 0600  Gross per 24 hour  Intake            117.9 ml  Output                0 ml  Net            117.9 ml   Filed Weights   08/29/16 0455  Weight: 89.7 kg (197 lb 12.8 oz)    Examination:  General exam: Appears calm and comfortable  Respiratory system: Clear to auscultation. Respiratory effort normal. Cardiovascular system: S1 & S2 heard, RRR. No JVD, murmurs, rubs, gallops or clicks. No pedal edema. Gastrointestinal system: Abdomen is nondistended, soft and nontender. No organomegaly or masses felt. Normal bowel sounds heard. Central nervous system: Alert and oriented. No focal neurological deficits. Extremities: Symmetric 5 x 5 power. Skin: No rashes, lesions or ulcers Psychiatry: Judgement and insight appear normal. Mood & affect appropriate.     Data Reviewed: I have personally reviewed following labs and imaging studies  CBC:  Recent Labs Lab 08/28/16 1809 08/29/16 0505  WBC 8.9 10.8*  HGB 17.5* 16.4  HCT 49.3 47.3  MCV 93.5 93.1  PLT 127* 0000000*   Basic Metabolic Panel:  Recent Labs Lab 08/28/16 1809 08/29/16 0506  NA 136 136  K 4.1 4.2  CL 100* 102  CO2 26 25  GLUCOSE 92 93  BUN 19 19  CREATININE 1.23 1.36*  CALCIUM 9.7 9.3  MG 2.1  --    GFR: CrCl cannot be calculated (Unknown ideal weight.). Liver Function Tests:  Recent Labs Lab 08/29/16 0506  AST 17  ALT 15*  ALKPHOS 59  BILITOT 0.7  PROT 6.4*  ALBUMIN 3.4*   No results for input(s): LIPASE, AMYLASE in the last 168 hours. No results for input(s): AMMONIA in the last 168 hours. Coagulation Profile: No results for input(s): INR, PROTIME in the last 168 hours. Cardiac Enzymes:  Recent Labs Lab 08/28/16 1809 08/28/16 2335 08/29/16 0506  TROPONINI <0.03 <0.03 <0.03   BNP (last  3 results) No results for input(s): PROBNP in the last 8760 hours. HbA1C: No results for input(s): HGBA1C in the last 72 hours. CBG: No results for input(s): GLUCAP in the last 168 hours. Lipid Profile: No results for input(s): CHOL, HDL, LDLCALC, TRIG, CHOLHDL, LDLDIRECT in the last 72 hours. Thyroid Function Tests: No results for input(s): TSH, T4TOTAL, FREET4, T3FREE, THYROIDAB in the last 72 hours. Anemia Panel: No results for input(s): VITAMINB12, FOLATE, FERRITIN, TIBC, IRON, RETICCTPCT in the last 72 hours. Sepsis Labs: No results for input(s): PROCALCITON, LATICACIDVEN in the last 168 hours.  Recent Results (from the past 240 hour(s))  MRSA PCR Screening     Status: None   Collection Time: 08/28/16  5:20 PM  Result Value Ref Range Status   MRSA by PCR NEGATIVE NEGATIVE Final    Comment:        The GeneXpert MRSA Assay (FDA approved for NASAL specimens only), is one component of a comprehensive MRSA colonization surveillance program. It is not intended to diagnose MRSA infection nor to guide or monitor treatment for MRSA infections.          Radiology Studies: No results found.      Scheduled Meds: . levothyroxine  75 mcg Oral QAC breakfast  . metoprolol tartrate  12.5 mg Oral BID  . pantoprazole  40 mg Oral Daily  . sodium chloride flush  3 mL Intravenous Q12H   Continuous Infusions: . heparin 1,300 Units/hr (08/29/16 1104)     LOS: 1 day    Time spent: 35 minutes.     Elmarie Shiley, MD [facility] Pager [phone]  If 7PM-7AM, please contact night-coverage [email or web address] Password [facility] 08/29/2016, 3:19 PM

## 2016-08-29 NOTE — Progress Notes (Signed)
ANTICOAGULATION CONSULT NOTE - Follow Up Consult  Pharmacy Consult for Heparin  Indication: pulmonary embolus  No Known Allergies  Patient Measurements: Weight: 197 lb 12.8 oz (89.7 kg)  Vital Signs: Temp: 97.9 F (36.6 C) (11/25 0455) Temp Source: Oral (11/25 0455) BP: 103/70 (11/25 0455) Pulse Rate: 97 (11/25 0455)  Labs:  Recent Labs  08/28/16 1809 08/28/16 1831 08/28/16 2335 08/29/16 0505 08/29/16 0506  HGB 17.5*  --   --  16.4  --   HCT 49.3  --   --  47.3  --   PLT 127*  --   --  128*  --   HEPARINUNFRC  --  0.31  --  0.50  --   CREATININE 1.23  --   --   --  1.36*  TROPONINI <0.03  --  <0.03  --  <0.03    CrCl cannot be calculated (Unknown ideal weight.).    Assessment: Heparin for PE, heparin level therapeutic x 2 Goal of Therapy:  Heparin level 0.3-0.7 units/ml Monitor platelets by anticoagulation protocol: Yes   Plan:  -Cont heparin 1300 units/hr -Daily CBC/HL -Monitor for bleeding -F/U plans for oral anti-coagulation   Darald, Braccia 08/29/2016,7:00 AM

## 2016-08-29 NOTE — Progress Notes (Signed)
ANTICOAGULATION CONSULT NOTE  Pharmacy Consult for IV heparin Indication: pulmonary embolus  No Known Allergies  Patient Measurements: Weight: 197 lb 12.8 oz (89.7 kg) Heparin Dosing Weight: 93.6kg  Vital Signs: Temp: 98 F (36.7 C) (11/25 0813) Temp Source: Oral (11/25 0813) BP: 103/82 (11/25 1045) Pulse Rate: 93 (11/25 1045)  Labs:  Recent Labs  08/28/16 1809 08/28/16 1831 08/28/16 2335 08/29/16 0505 08/29/16 0506  HGB 17.5*  --   --  16.4  --   HCT 49.3  --   --  47.3  --   PLT 127*  --   --  128*  --   HEPARINUNFRC  --  0.31  --  0.50  --   CREATININE 1.23  --   --   --  1.36*  TROPONINI <0.03  --  <0.03  --  <0.03    CrCl cannot be calculated (Unknown ideal weight.).   Assessment: 44 YOM transferred from [facility] with submassive PE after having rotator cuff surgery 6 weeks ago. Patient has history of AFib- patient states he was on warfarin ~5 years ago, but was taken off of it after he had nosebleeds.  PLTC low at admission (120s). Hgb stable. Trops <0.03. No s/sx of bleeding noted.   Stat HL 0.31 (slightly therapeutic on 1200 units/hr), and dose increased to 1300 units/hr. Current HL therapeutic (0.50). Will reassess with daily labs.   Goal of Therapy:  Heparin level 0.3-0.7 units/ml Monitor platelets by anticoagulation protocol: Yes   Plan:  -Cont heparin 1300 units/hr -Daily CBC/HL -monitor signs/symptoms of bleeding -F/U oral AC

## 2016-08-30 ENCOUNTER — Inpatient Hospital Stay (HOSPITAL_COMMUNITY): Payer: Medicare Other

## 2016-08-30 DIAGNOSIS — I2699 Other pulmonary embolism without acute cor pulmonale: Secondary | ICD-10-CM

## 2016-08-30 LAB — CBC
HEMATOCRIT: 47.1 % (ref 39.0–52.0)
Hemoglobin: 16 g/dL (ref 13.0–17.0)
MCH: 32 pg (ref 26.0–34.0)
MCHC: 34 g/dL (ref 30.0–36.0)
MCV: 94.2 fL (ref 78.0–100.0)
PLATELETS: 124 10*3/uL — AB (ref 150–400)
RBC: 5 MIL/uL (ref 4.22–5.81)
RDW: 13.1 % (ref 11.5–15.5)
WBC: 9.6 10*3/uL (ref 4.0–10.5)

## 2016-08-30 LAB — HEPARIN LEVEL (UNFRACTIONATED): HEPARIN UNFRACTIONATED: 0.4 [IU]/mL (ref 0.30–0.70)

## 2016-08-30 LAB — BASIC METABOLIC PANEL
ANION GAP: 10 (ref 5–15)
BUN: 19 mg/dL (ref 6–20)
CALCIUM: 9.2 mg/dL (ref 8.9–10.3)
CO2: 26 mmol/L (ref 22–32)
Chloride: 102 mmol/L (ref 101–111)
Creatinine, Ser: 1.37 mg/dL — ABNORMAL HIGH (ref 0.61–1.24)
GFR, EST AFRICAN AMERICAN: 55 mL/min — AB (ref >60)
GFR, EST NON AFRICAN AMERICAN: 47 mL/min — AB (ref >60)
GLUCOSE: 86 mg/dL (ref 65–99)
POTASSIUM: 3.9 mmol/L (ref 3.5–5.1)
Sodium: 138 mmol/L (ref 135–145)

## 2016-08-30 MED ORDER — GUAIFENESIN 200 MG PO TABS
400.0000 mg | ORAL_TABLET | Freq: Two times a day (BID) | ORAL | Status: DC
  Administered 2016-08-30 – 2016-09-01 (×5): 400 mg via ORAL
  Filled 2016-08-30 (×5): qty 2

## 2016-08-30 NOTE — Progress Notes (Signed)
VASCULAR LAB PRELIMINARY  PRELIMINARY  PRELIMINARY  PRELIMINARY  Bilateral upper extremity venous duplex completed.    Preliminary report:  There is acute, non occluisve DVT noted in the right axillary vein. There is superficial thrombosis noted in the bilateral basilic veins.  Sluggish flow noted throughout both upper extremities.   Called report to [place], RN  Mauro Kaufmann, [facility], RVT 08/30/2016, 12:56 PM

## 2016-08-30 NOTE — Progress Notes (Signed)
VASCULAR LAB PRELIMINARY  PRELIMINARY  PRELIMINARY  PRELIMINARY  Bilateral lower extremity venous duplex completed.    Preliminary report:  There is no obvious evidence of DVT or SVT noted in the bilateral lower extremities.  Sluggish flow noted throughout the bilateral lower extremities.   Karmela Bram, RVT 08/30/2016, 1:00 PM

## 2016-08-30 NOTE — Progress Notes (Signed)
ANTICOAGULATION CONSULT NOTE  Pharmacy Consult for IV heparin Indication: pulmonary embolus  No Known Allergies  Patient Measurements: Weight: 199 lb 3.2 oz (90.4 kg) Heparin Dosing Weight: 93.6kg  Vital Signs: Temp: 97.9 F (36.6 C) (11/26 0829) Temp Source: Oral (11/26 0829) BP: 112/72 (11/26 0829) Pulse Rate: 91 (11/26 0829)  Labs:  Recent Labs  08/28/16 1809 08/28/16 1831 08/28/16 2335 08/29/16 0505 08/29/16 0506 08/30/16 0406  HGB 17.5*  --   --  16.4  --  16.0  HCT 49.3  --   --  47.3  --  47.1  PLT 127*  --   --  128*  --  124*  HEPARINUNFRC  --  0.31  --  0.50  --  0.40  CREATININE 1.23  --   --   --  1.36* 1.37*  TROPONINI <0.03  --  <0.03  --  <0.03  --     CrCl cannot be calculated (Unknown ideal weight.).   Assessment: 89 YOM transferred from [facility] with submassive PE after having rotator cuff surgery 6 weeks ago. Patient has history of AFib- patient states he was on warfarin ~5 years ago, but was taken off of it after he had nosebleeds.  Stat level 0.31 on 1200 units/hr, and dose increased to 1300 units/hr; level increased to 0.50.  Daily HL 0.40 (therapeutic). Will continue current rate. Hgb normal, pltc 120s (stable since admission). No s/sx of bleeding noted. Internal medicine wants patient to continue heparin gtt x48-72 hours hen transition to eliquis.   Goal of Therapy:  Heparin level 0.3-0.7 units/ml Monitor platelets by anticoagulation protocol: Yes   Plan:  -Cont heparin 1300 units/hr -Daily CBC/Heparin level -F/U transition to eliquis

## 2016-08-30 NOTE — Progress Notes (Signed)
PROGRESS NOTE    Taylor Spencer  [id number] DOB: 04-28-1935 DOA: 08/28/2016 PCP: Nicoletta Dress, MD    Brief Narrative:  Taylor Spencer is a 81 y.o. male with medical history significant of Hypothyroidism, HTN, A fib, history of PE 5 years ago, who presents to [facility] complaining of SOB. He relates that he was walking from his car to his apartment and he was very SOB. He got home and call his daughter, he thought he was going to died. He relates SOB just by talking. He denies chest pain. He had Rotator cuff surgery on September 26.   ED Course: Patient was transfer from [facility] for further evaluation of PE. He had A CT angio that showed submassive PE and Right heart strain. Labs from other facility, cr 1.4, hb normal.    Assessment & Plan:   Active Problems:   Acute pulmonary embolism (HCC)   Atrial fibrillation (HCC)   Hypothyroidism   Pulmonary embolism (HCC)   Chronic thoracic aortic dissection (HCC)   1--Bilateral Pulmonary Embolism, submassive with right side heart strain on CT scan;  Continue with heparin Gtt for total 48 to 72 hour.  Patient will like transition to Eliquis when appropriate.  ECHO RV with mild decrease ventricul;ar function.  and doppler LE negative for DVT, right upper positive for right axillary vein, superficial thrombosis basilic veins.  Appreciate Dr Meda Coffee help./ discussed ECHO results with Dr Meda Coffee.  Pain right lower quadrant, radiates to back, right upper chest. Abdominal exam benign. No pain on palpation. If continue might need imaging.   2-A fib; continue with metoprolol, lower dose to avoid hypotension.   3-Hypothyroidism;  Continue with synthroid.   4-COPD; continue with albuterol, ipratroium PRN.   5-Chronic Type B aortic dissection;  CVTS consulted.   6-Hemoptysis: Very small amount, maybe from bronchitis, vs PE, vs Infarct.  Monitor closely. Start guaifenesin.  Check chest x ray.     DVT prophylaxis:  heparin Gtt  Code Status: Full code.  Family Communication: care discussed with Son who was at bedside.  Disposition Plan; remain inpatient.    Consultants:   Pulmonary   CVTS   Procedures:   ECHO pending.   Doppler.    Antimicrobials:  none  Subjective: He is breathing better that when he came. Had some lower quadrant abdominal pain, radiates to chest.   Objective: Vitals:   08/30/16 0041 08/30/16 0300 08/30/16 0645 08/30/16 0829  BP: 99/69 115/76 112/72 112/72  Pulse: 83 93 (!) 102 91  Resp: (!) 21 (!) 21 (!) 21 18  Temp: 98.1 F (36.7 C) 98.1 F (36.7 C)  97.9 F (36.6 C)  TempSrc: Oral Oral  Oral  SpO2: 95% 93% 98% 97%  Weight:  90.4 kg (199 lb 3.2 oz)      Intake/Output Summary (Last 24 hours) at 08/30/16 0941 Last data filed at 08/30/16 0600  Gross per 24 hour  Intake             1118 ml  Output                0 ml  Net             1118 ml   Filed Weights   08/29/16 0455 08/30/16 0300  Weight: 89.7 kg (197 lb 12.8 oz) 90.4 kg (199 lb 3.2 oz)    Examination:  General exam: Appears calm and comfortable  Respiratory system: Clear to auscultation. Respiratory effort normal. Cardiovascular system: S1 & S2  heard, RRR. No JVD, murmurs, rubs, gallops or clicks. No pedal edema. Gastrointestinal system: Abdomen is nondistended, soft and nontender. No organomegaly or masses felt. Normal bowel sounds heard. Central nervous system: Alert and oriented. No focal neurological deficits. Extremities: Symmetric 5 x 5 power. Skin: No rashes, lesions or ulcers Psychiatry: Judgement and insight appear normal. Mood & affect appropriate.     Data Reviewed: I have personally reviewed following labs and imaging studies  CBC:  Recent Labs Lab 08/28/16 1809 08/29/16 0505 08/30/16 0406  WBC 8.9 10.8* 9.6  HGB 17.5* 16.4 16.0  HCT 49.3 47.3 47.1  MCV 93.5 93.1 94.2  PLT 127* 128* [id number]*   Basic Metabolic Panel:  Recent Labs Lab 08/28/16 1809 08/29/16 0506  08/30/16 0406  NA 136 136 138  K 4.1 4.2 3.9  CL 100* 102 102  CO2 26 25 26   GLUCOSE 92 93 86  BUN 19 19 19   CREATININE 1.23 1.36* 1.37*  CALCIUM 9.7 9.3 9.2  MG 2.1  --   --    GFR: CrCl cannot be calculated (Unknown ideal weight.). Liver Function Tests:  Recent Labs Lab 08/29/16 0506  AST 17  ALT 15*  ALKPHOS 59  BILITOT 0.7  PROT 6.4*  ALBUMIN 3.4*   No results for input(s): LIPASE, AMYLASE in the last 168 hours. No results for input(s): AMMONIA in the last 168 hours. Coagulation Profile: No results for input(s): INR, PROTIME in the last 168 hours. Cardiac Enzymes:  Recent Labs Lab 08/28/16 1809 08/28/16 2335 08/29/16 0506  TROPONINI <0.03 <0.03 <0.03   BNP (last 3 results) No results for input(s): PROBNP in the last 8760 hours. HbA1C: No results for input(s): HGBA1C in the last 72 hours. CBG: No results for input(s): GLUCAP in the last 168 hours. Lipid Profile: No results for input(s): CHOL, HDL, LDLCALC, TRIG, CHOLHDL, LDLDIRECT in the last 72 hours. Thyroid Function Tests: No results for input(s): TSH, T4TOTAL, FREET4, T3FREE, THYROIDAB in the last 72 hours. Anemia Panel: No results for input(s): VITAMINB12, FOLATE, FERRITIN, TIBC, IRON, RETICCTPCT in the last 72 hours. Sepsis Labs: No results for input(s): PROCALCITON, LATICACIDVEN in the last 168 hours.  Recent Results (from the past 240 hour(s))  MRSA PCR Screening     Status: None   Collection Time: 08/28/16  5:20 PM  Result Value Ref Range Status   MRSA by PCR NEGATIVE NEGATIVE Final    Comment:        The GeneXpert MRSA Assay (FDA approved for NASAL specimens only), is one component of a comprehensive MRSA colonization surveillance program. It is not intended to diagnose MRSA infection nor to guide or monitor treatment for MRSA infections.          Radiology Studies: No results found.      Scheduled Meds: . guaiFENesin  400 mg Oral BID  . levothyroxine  75 mcg Oral QAC  breakfast  . metoprolol tartrate  12.5 mg Oral BID  . pantoprazole  40 mg Oral Daily  . sodium chloride flush  3 mL Intravenous Q12H   Continuous Infusions: . heparin 1,300 Units/hr (08/30/16 0752)     LOS: 2 days    Time spent: 35 minutes.     Elmarie Shiley, MD [facility] Pager [phone]  If 7PM-7AM, please contact night-coverage [email or web address] Password [facility] 08/30/2016, 9:41 AM

## 2016-08-30 NOTE — Progress Notes (Signed)
Pt had an episode of hyperventilation and shivering/chills. Pt states shivering occurred prior to SOB. States felt like he could not catch a good deep breath. Prior to this; day shift nurse changed IV from right arm to left arm. Pt respirations were high 20s-30s. BP 149/93, and HR 100s. O2 sats were in low 80s/ high 70s. On 2L [place] Increased O2 to 4L  Now O2 sats are 97-100. Applied hot packs and blankets on patient and shivering stopped. Repeat BP 118/90. Pt states breathing became better. Will continue to monitor. If occurs again will notify on call provider.

## 2016-08-31 ENCOUNTER — Inpatient Hospital Stay (HOSPITAL_COMMUNITY): Payer: Medicare Other

## 2016-08-31 DIAGNOSIS — I2699 Other pulmonary embolism without acute cor pulmonale: Principal | ICD-10-CM

## 2016-08-31 LAB — CBC
HCT: 46.6 % (ref 39.0–52.0)
HEMOGLOBIN: 16.1 g/dL (ref 13.0–17.0)
MCH: 32.2 pg (ref 26.0–34.0)
MCHC: 34.5 g/dL (ref 30.0–36.0)
MCV: 93.2 fL (ref 78.0–100.0)
Platelets: 126 10*3/uL — ABNORMAL LOW (ref 150–400)
RBC: 5 MIL/uL (ref 4.22–5.81)
RDW: 12.7 % (ref 11.5–15.5)
WBC: 9 10*3/uL (ref 4.0–10.5)

## 2016-08-31 LAB — HEPARIN LEVEL (UNFRACTIONATED): Heparin Unfractionated: 0.46 IU/mL (ref 0.30–0.70)

## 2016-08-31 MED ORDER — APIXABAN 5 MG PO TABS
10.0000 mg | ORAL_TABLET | Freq: Two times a day (BID) | ORAL | Status: DC
  Administered 2016-08-31 – 2016-09-01 (×2): 10 mg via ORAL
  Filled 2016-08-31 (×2): qty 2

## 2016-08-31 MED ORDER — APIXABAN 5 MG PO TABS: 5.0000 mg | ORAL_TABLET | Freq: Two times a day (BID) | ORAL | Status: DC

## 2016-08-31 MED FILL — Heparin Sodium (Porcine) 100 Unt/ML in Sodium Chloride 0.45%: INTRAMUSCULAR | Qty: 250 | Status: AC

## 2016-08-31 NOTE — Progress Notes (Signed)
ANTICOAGULATION CONSULT NOTE - Initial Consult  Pharmacy Consult for Eliquis Indication: pulmonary embolus  No Known Allergies  Patient Measurements: Height: 6' (182.9 cm) Weight: 198 lb 9.6 oz (90.1 kg) IBW/kg (Calculated) : 77.6  Vital Signs: Temp: 97.5 F (36.4 C) (11/27 1500) Temp Source: Oral (11/27 1500) BP: 113/83 (11/27 1500) Pulse Rate: 92 (11/27 1500)  Labs:  Recent Labs  08/28/16 1809  08/28/16 2335 08/29/16 0505 08/29/16 0506 08/30/16 0406 08/31/16 0553  HGB 17.5*  --   --  16.4  --  16.0 16.1  HCT 49.3  --   --  47.3  --  47.1 46.6  PLT 127*  --   --  128*  --  124* 126*  HEPARINUNFRC  --   < >  --  0.50  --  0.40 0.46  CREATININE 1.23  --   --   --  1.36* 1.37*  --   TROPONINI <0.03  --  <0.03  --  <0.03  --   --   < > = values in this interval not displayed.  Estimated Creatinine Clearance: 48 mL/min (by C-G formula based on SCr of 1.37 mg/dL (H)).  Medical History: Past Medical History:  Diagnosis Date  . Atrial fibrillation ([place])    not on anticoagulation  . Barrett's esophagus   . Chronic thoracic aortic dissection (HCC)    type B noted on CTA chest  . Coronary artery disease    stent in 2010  . Emphysema of lung ([place])    seen on CT imaging  . History of colon polyps   . Hypothyroid   . Lung nodule < 6cm on CT 08/27/2016   83mm right upper lobe without change compared with previous CT 12/06/12 per radiology  . Pulmonary embolism ([place]) 2012   Treated w/ 1-2 weeks of anticoagulation & stopped due to epistaxis. No clear cause.   Medications:  Prescriptions Prior to Admission  Medication Sig Dispense Refill Last Dose  . albuterol (PROVENTIL) (2.5 MG/3ML) 0.083% nebulizer solution Take 2.5 mg by nebulization every 6 (six) hours as needed for wheezing or shortness of breath.   Past Month at Unknown time  . aspirin EC 81 MG tablet Take 81 mg by mouth daily.   08/27/2016 at Unknown time  . furosemide (LASIX) 80 MG tablet Take 80 mg by mouth daily  as needed for edema.   Past Month at Unknown time  . GINSENG EXTRACT PO Take by mouth.   08/27/2016 at Unknown time  . levothyroxine (SYNTHROID, LEVOTHROID) 75 MCG tablet Take 75 mcg by mouth daily before breakfast.   08/27/2016 at Unknown time  . metoprolol tartrate (LOPRESSOR) 25 MG tablet Take 25 mg by mouth 2 (two) times daily.   08/27/2016 at 0800  . Omega-3 Fatty Acids (FISH OIL) 1000 MG CAPS Take 1 capsule by mouth.   08/27/2016 at Unknown time  . omeprazole (PRILOSEC) 20 MG capsule Take 20 mg by mouth daily.   08/27/2016 at Unknown time  . OVER THE COUNTER MEDICATION Take 1 tablet by mouth daily.   08/27/2016 at Unknown time  . OVER THE COUNTER MEDICATION Take 15 mLs by mouth daily.   08/27/2016 at Unknown time  . vitamin B-12 (CYANOCOBALAMIN) 250 MCG tablet Take 250 mcg by mouth daily.   Past Month at Unknown time    Assessment: 45 YOM transferred from [facility] with submassive PE after having rotator cuff surgery 6 weeks ago. Patient has history of AFib- patient states he was  on warfarin ~5 years ago, but was taken off of it after he had nosebleeds. Patient has been therapeutic on heparin with no reported bleeding. Patient will be transitioned to Eliquis for discharge.  Goal of Therapy:  aPTT 66-102 seconds Monitor platelets by anticoagulation protocol: Yes   Plan:  Eliquis 10mg  BID x7 days, then Eliquis 5mg  BID Continue to monitor renal function, H&H and platelets  Redford Behrle L Miryah Ralls 08/31/2016,5:06 PM

## 2016-08-31 NOTE — Progress Notes (Signed)
Name: Taylor Spencer MRN: [id number] DOB: [birth year]    ADMISSION DATE:  08/28/2016 CONSULTATION DATE:  08/28/2016  REFERRING MD :  Niel Hummer, M.D. / [facility]  CHIEF COMPLAINT:  Submassive PE  BRIEF PATIENT DESCRIPTION: 81 y.o. male transferred from outside hospital this evening with history of pulmonary embolism and proximally 5 years ago. Patient had sudden onset of dyspnea earlier today. Patient underwent imaging with CT angiogram showing submassive pulmonary embolism and findings concerning for right heart strain. Patient transferred to our facility for pulmonary evaluation for possible thrombolytic or catheter directed thrombolytic therapy. Documentation shows lowest saturation recorded of 90% presumably on room air at 7:38 PM on 11/23. Patient has no recorded hypotension in notes sent. The patient himself reports that he was at his daughter's yesterday for the holiday and noted at approximately 3 PM after returning from the bathroom that he was more dyspneic than normal. His daughter reports that over the last 2-3 weeks he has been noticeably more short of breath with exertion. The patient reports that after driving a short distance to his apartment and after walking 150 feet to his front door he had severe dyspnea and feeling as though he was "hyperventilating". He denies any associated chest pressure, pain, or palpitations. He denies any syncope or near syncope. The patient then contacted his family and went to the local emergency room for evaluation. The patient reports he does have occasional edema in his legs which is attributed to his varicose veins but denies any consistent lower extremity edema or pain. He denies any edema in his right arm but does note that his arm was immobilized for a period of time in a sling postoperatively in September. He denies any subjective fever, chills, or sweats. He denies any abdominal pain, nausea, or vomiting. He denies any rashes or abnormal  bruising. Of note the patient has taken multiple trips recently but has not been in a car for prolonged periods of time without ambulation. The patient does have a history of pulmonary embolism approximately 5 years ago which was treated for 1-2 weeks for systemic anticoagulation and then stop secondary to epistaxis without further treatment per the patient.  SIGNIFICANT EVENTS  11/24 - Transfer from [place] w/ submassive PE  STUDIES:  CTA CHEST 11/24 [name]): Per radiology report. Prominent filling defects in the right and left main pulmonary arteries with extension into bilateral upper and lower lobe branch vessels. RV/LV ratio 1.6 suggesting evidence of right heart strain. No pericardial effusion. No evidence of aortic aneurysm. Eccentric mural thrombus in thoracic aorta extending from posterior arch throughout the descending aorta and into the abdominal hiatus. This is consistent with a chronic type B dissection. No change in appearance since previous study. No pathologic mediastinal adenopathy. Small esophageal hiatal hernia. Enlarged thyroid gland. Diffuse emphysematous changes.focal pulmonary nodule in right upper lobe measuring 7 mm. This is not reportedly change since study in 12/06/12. No pleural effusion or pneumothorax. Notably enlarged left thyroid lobe without change. BUE dopplers 11/26>>> POS for non occlusive R axillary DVT  BLE dopplers 11/26>>> neg for DVT   OUTSIDE LABS (08/27/16): CBC: 8.9/17.4/30.5/1:30 BMP: 138/4.3/101/26/24/1.40/89/10.1 LFT: 4.2/7.5/0.5/73/21/36 INR: 1.0 PTT: 27.9 Troponin I:  <0.01 (repeat troponin I the same) ProBNP: 591  (0 - 1800) ABG (on room air): 7.43/32/68   SUBJECTIVE: PCCM called back for hypoxia and recs for when to transition to PO rx, etc.   VITAL SIGNS: Temp:  [97.3 F (36.3 C)-98.2 F (36.8 C)] 97.6 F (36.4 C) (11/27  [id number]) Pulse Rate:  [84-117] 102 (11/27 0822) Resp:  [18-26] 18 (11/27 0822) BP: (97-118)/(72-90) 110/80 (11/27  0820) SpO2:  [96 %-99 %] 97 % (11/27 0822) Weight:  [90.1 kg (198 lb 9.6 oz)] 90.1 kg (198 lb 9.6 oz) (11/27 0443)  PHYSICAL EXAMINATION: General: pleasant elderly male, NAD  Neuro:awake, alert, appropriate, MAE  CV:  s1s2 rrr PULM:  resps even non labored on RA, sats 94%, slightly diminished bases otherwise essentially clear  GI: round, soft, non tender  Extremities: warm and dry, no edema     Recent Labs Lab 08/28/16 1809 08/29/16 0506 08/30/16 0406  NA 136 136 138  K 4.1 4.2 3.9  CL 100* 102 102  CO2 26 25 26   BUN 19 19 19   CREATININE 1.23 1.36* 1.37*  GLUCOSE 92 93 86    Recent Labs Lab 08/29/16 0505 08/30/16 0406 08/31/16 0553  HGB 16.4 16.0 16.1  HCT 47.3 47.1 46.6  WBC 10.8* 9.6 9.0  PLT 128* 124* 126*   Dg Chest 2 View  Result Date: 08/30/2016 CLINICAL DATA:  81 year old male with hemoptysis and slight shortness of breath. History of recently diagnosed sub massive peak and chronic type B thoracic aortic dissection. EXAM: CHEST  2 VIEW COMPARISON:  Prior chest x-ray 08/27/2016; prior CT chest 08/27/2016 FINDINGS: Stable cardiac and mediastinal contours. The shadow of the transverse aorta remains prominent consistent with known chronic type B dissection and ectasia. No new airspace opacity, pleural effusion or pneumothorax. Chronic interstitial prominence and bronchitic changes are again noted. No evidence of pulmonary edema. Fat containing hiatal hernia. No acute osseous abnormality. IMPRESSION: 1. No acute cardiopulmonary process. 2. Stable chronic bronchitic changes and interstitial prominence. 3. Stable ectasia of the transverse aorta in the region of known chronic [place] type B dissection. Electronically Signed   By: Jacqulynn Cadet M.D.   On: 08/30/2016 11:49    ASSESSMENT / PLAN:    81yo male with hx PE 5 years ago, now with submassive PE and R axillary DVT after shoulder surgery.  No evidence significant R heart strain.  Hemodynamically stable.  Likely  has underlying lung disease.  Has hx OSA, has refused CPAP in past, now willing to consider.   PE/DVT  PLAN -  Ok to transition to oral rx  Will need lifelong anticoagulation  Ok to ambulate  Check ambulatory desat prior to d/c - may need home O2  Will f/u CXR today and consider gentle diuresis  Wean supplemental O2 as able -- currently documented that he is on 4L but he had O2 off the entire time I was in room (15-58mins) and sats were stable 92-95%  Emphysema - previous tobacco abuse and pt was long time coal miner  ?OSA PLAN -  outpt pulm f/u  Check PFTs and Alpha-1 as outpt  May need repeat sleep study - would be willing to reconsider CPAP   Right Upper Lobe Nodule: Measured at 7 mm per radiology report. Reportedly stable since 2014  PLAN -  outpt f/u   Outpt pulmonary appt made with Dr. Ashok Cordia -- 12/14 @215pm    Nickolas Madrid, NP 08/31/2016  9:18 AM Pager: [phone] or [phone]

## 2016-08-31 NOTE — Progress Notes (Signed)
PROGRESS NOTE    Taylor Spencer  [id number] DOB: [birth year] DOA: 08/28/2016 PCP: Nicoletta Dress, MD    Brief Narrative:  Taylor Spencer is a 81 y.o. male with medical history significant of Hypothyroidism, HTN, A fib, history of PE 5 years ago, who presents to [facility] complaining of SOB. He relates that he was walking from his car to his apartment and he was very SOB. He got home and call his daughter, he thought he was going to died. He relates SOB just by talking. He denies chest pain. He had Rotator cuff surgery on September 26.   ED Course: Patient was transfer from [facility] for further evaluation of PE. He had A CT angio that showed submassive PE and Right heart strain. Labs from other facility, cr 1.4, hb normal.    Assessment & Plan:   Active Problems:   Acute pulmonary embolism (HCC)   Atrial fibrillation (HCC)   Hypothyroidism   Pulmonary embolism (HCC)   Chronic thoracic aortic dissection (HCC)   1--Bilateral Pulmonary Embolism, submassive with right side heart strain on CT scan;  Continue with heparin Gtt for total 48 to 72 hour.  Patient will like transition to Eliquis when appropriate.  ECHO RV with mild decrease ventricul;ar function.  and doppler LE negative for DVT, right upper positive for right axillary vein, superficial thrombosis basilic veins.  Appreciate Dr Meda Coffee help./ discussed ECHO results with Dr Meda Coffee.  Pain right lower quadrant, radiates to back, right upper chest. Abdominal exam benign. No pain on palpation. Denies worsening pain today.  Patient feeling ok this morning, he is requiring more oxygen, now on 4 L. Will re-consult CCM. Will defer to CCM lasix, due to soft BP.  Transition to eliquis later today if ok by CCM and if patient 's insurance will cover.    2-A fib; continue with metoprolol, lower dose to avoid hypotension.   3-Hypothyroidism;  Continue with synthroid.   4-COPD; continue with albuterol, ipratroium  PRN.   5-Chronic Type B aortic dissection;  CVTS consulted. No further intervention needed.   6-Hemoptysis: Very small amount, maybe from bronchitis, vs PE, vs Infarct.  Monitor closely. Start guaifenesin.  Chest x ray 11-27; stable, chronic interstitial changes.    DVT prophylaxis: heparin Gtt  Code Status: Full code.  Family Communication: care discussed with Daughter  who was at bedside.  Disposition Plan; remain inpatient. PT later today if no further intervention.    Consultants:   Pulmonary   CVTS   Procedures:   ECHO normal Ef.   Doppler.    Antimicrobials:  none  Subjective: He denies worsening pain, relates pleuritic chest pain, radiates to lower abdomen.  He denies worsening dyspnea at this time.  He had an episode last night wher he had chills, SOB, Oxygen drops.   Objective: Vitals:   08/30/16 1730 08/30/16 2001 08/31/16 0013 08/31/16 0443  BP: 101/76 118/90 97/80 100/78  Pulse: 89 98 84 (!) 117  Resp: 19 (!) 25 20 18   Temp:  98.2 F (36.8 C) 97.3 F (36.3 C) 97.6 F (36.4 C)  TempSrc:  Oral Oral Oral  SpO2: 96% 99% 96% 96%  Weight:    90.1 kg (198 lb 9.6 oz)  Height:    6' (1.829 m)    Intake/Output Summary (Last 24 hours) at 08/31/16 0817 Last data filed at 08/31/16 0600  Gross per 24 hour  Intake              772 ml  Output              675 ml  Net               97 ml   Filed Weights   08/29/16 0455 08/30/16 0300 08/31/16 0443  Weight: 89.7 kg (197 lb 12.8 oz) 90.4 kg (199 lb 3.2 oz) 90.1 kg (198 lb 9.6 oz)    Examination:  General exam: Appears calm and comfortable  Respiratory system: Clear to auscultation. Respiratory effort normal. Cardiovascular system: S1 & S2 heard, RRR. No JVD, murmurs, rubs, gallops or clicks. No pedal edema. Gastrointestinal system: Abdomen is nondistended, soft and nontender. No organomegaly or masses felt. Normal bowel sounds heard. Central nervous system: Alert and oriented. No focal neurological  deficits. Extremities: Symmetric 5 x 5 power. Skin: No rashes, lesions or ulcers Psychiatry: Judgement and insight appear normal. Mood & affect appropriate.     Data Reviewed: I have personally reviewed following labs and imaging studies  CBC:  Recent Labs Lab 08/28/16 1809 08/29/16 0505 08/30/16 0406 08/31/16 0553  WBC 8.9 10.8* 9.6 9.0  HGB 17.5* 16.4 16.0 16.1  HCT 49.3 47.3 47.1 46.6  MCV 93.5 93.1 94.2 93.2  PLT 127* 128* 124* 123XX123*   Basic Metabolic Panel:  Recent Labs Lab 08/28/16 1809 08/29/16 0506 08/30/16 0406  NA 136 136 138  K 4.1 4.2 3.9  CL 100* 102 102  CO2 26 25 26   GLUCOSE 92 93 86  BUN 19 19 19   CREATININE 1.23 1.36* 1.37*  CALCIUM 9.7 9.3 9.2  MG 2.1  --   --    GFR: Estimated Creatinine Clearance: 48 mL/min (by C-G formula based on SCr of 1.37 mg/dL (H)). Liver Function Tests:  Recent Labs Lab 08/29/16 0506  AST 17  ALT 15*  ALKPHOS 59  BILITOT 0.7  PROT 6.4*  ALBUMIN 3.4*   No results for input(s): LIPASE, AMYLASE in the last 168 hours. No results for input(s): AMMONIA in the last 168 hours. Coagulation Profile: No results for input(s): INR, PROTIME in the last 168 hours. Cardiac Enzymes:  Recent Labs Lab 08/28/16 1809 08/28/16 2335 08/29/16 0506  TROPONINI <0.03 <0.03 <0.03   BNP (last 3 results) No results for input(s): PROBNP in the last 8760 hours. HbA1C: No results for input(s): HGBA1C in the last 72 hours. CBG: No results for input(s): GLUCAP in the last 168 hours. Lipid Profile: No results for input(s): CHOL, HDL, LDLCALC, TRIG, CHOLHDL, LDLDIRECT in the last 72 hours. Thyroid Function Tests: No results for input(s): TSH, T4TOTAL, FREET4, T3FREE, THYROIDAB in the last 72 hours. Anemia Panel: No results for input(s): VITAMINB12, FOLATE, FERRITIN, TIBC, IRON, RETICCTPCT in the last 72 hours. Sepsis Labs: No results for input(s): PROCALCITON, LATICACIDVEN in the last 168 hours.  Recent Results (from the past 240  hour(s))  MRSA PCR Screening     Status: None   Collection Time: 08/28/16  5:20 PM  Result Value Ref Range Status   MRSA by PCR NEGATIVE NEGATIVE Final    Comment:        The GeneXpert MRSA Assay (FDA approved for NASAL specimens only), is one component of a comprehensive MRSA colonization surveillance program. It is not intended to diagnose MRSA infection nor to guide or monitor treatment for MRSA infections.          Radiology Studies: Dg Chest 2 View  Result Date: 08/30/2016 CLINICAL DATA:  81 year old male with hemoptysis and slight shortness of breath. History of recently diagnosed  sub massive peak and chronic type B thoracic aortic dissection. EXAM: CHEST  2 VIEW COMPARISON:  Prior chest x-ray 08/27/2016; prior CT chest 08/27/2016 FINDINGS: Stable cardiac and mediastinal contours. The shadow of the transverse aorta remains prominent consistent with known chronic type B dissection and ectasia. No new airspace opacity, pleural effusion or pneumothorax. Chronic interstitial prominence and bronchitic changes are again noted. No evidence of pulmonary edema. Fat containing hiatal hernia. No acute osseous abnormality. IMPRESSION: 1. No acute cardiopulmonary process. 2. Stable chronic bronchitic changes and interstitial prominence. 3. Stable ectasia of the transverse aorta in the region of known chronic Stanford type B dissection. Electronically Signed   By: Jacqulynn Cadet M.D.   On: 08/30/2016 11:49        Scheduled Meds: . guaiFENesin  400 mg Oral BID  . levothyroxine  75 mcg Oral QAC breakfast  . metoprolol tartrate  12.5 mg Oral BID  . pantoprazole  40 mg Oral Daily  . sodium chloride flush  3 mL Intravenous Q12H   Continuous Infusions: . heparin 1,300 Units/hr (08/31/16 0327)     LOS: 3 days    Time spent: 35 minutes.     Elmarie Shiley, MD [facility] Pager [phone]  If 7PM-7AM, please contact night-coverage [email or web address] Password  [facility] 08/31/2016, 8:17 AM

## 2016-08-31 NOTE — Progress Notes (Signed)
ANTICOAGULATION CONSULT NOTE  Pharmacy Consult for IV heparin Indication: pulmonary embolus  No Known Allergies  Patient Measurements: Height: 6' (182.9 cm) Weight: 198 lb 9.6 oz (90.1 kg) IBW/kg (Calculated) : 77.6 Heparin Dosing Weight: 93.6kg  Vital Signs: Temp: 97.6 F (36.4 C) (11/27 0822) Temp Source: Axillary (11/27 0822) BP: 110/80 (11/27 0820) Pulse Rate: 102 (11/27 0822)  Labs:  Recent Labs  08/28/16 1809  08/28/16 2335 08/29/16 0505 08/29/16 0506 08/30/16 0406 08/31/16 0553  HGB 17.5*  --   --  16.4  --  16.0 16.1  HCT 49.3  --   --  47.3  --  47.1 46.6  PLT 127*  --   --  128*  --  124* 126*  HEPARINUNFRC  --   < >  --  0.50  --  0.40 0.46  CREATININE 1.23  --   --   --  1.36* 1.37*  --   TROPONINI <0.03  --  <0.03  --  <0.03  --   --   < > = values in this interval not displayed.  Estimated Creatinine Clearance: 48 mL/min (by C-G formula based on SCr of 1.37 mg/dL (H)).   Assessment: 42 YOM transferred from [facility] with submassive PE after having rotator cuff surgery 6 weeks ago. Patient has history of AFib- patient states he was on warfarin ~5 years ago, but was taken off of it after he had nosebleeds.  Heparin level therapeutic this AM  Goal of Therapy:  Heparin level 0.3-0.7 units/ml Monitor platelets by anticoagulation protocol: Yes   Plan:  -Cont heparin 1300 units/hr -Daily CBC/Heparin level -F/U transition to eliquis  Thank you Anette Guarneri, PharmD [phone]

## 2016-08-31 NOTE — Progress Notes (Signed)
Patient has private insurance with [employer] with prescription drug coverage; Benefit check in progress for the co pay for Eliquis. Coupon card given to the patient with explanation of usage. Son at bedside. Mindi Slicker [facility] [phone]

## 2016-08-31 NOTE — Evaluation (Signed)
Physical Therapy Evaluation Patient Details Name: Taylor Spencer MRN: [id number] DOB: 04-May-1935 Today's Date: 08/31/2016   History of Present Illness  Pt is a 81 y/o male with PMH significant for PE,emphysema, HTN, and afid, admitted with sudden onset of dyspnea with exertion found to have submassive PE.  Clinical Impression  Pt currently performing all mobility with supervision or better.  Son present during evaluation and agreeable to provide supervision should pt wish to ambulate in room or hallway.  Educated son and patient on monitoring O2 saturations and pacing.  O2 as low as 82% on room air when exiting bathroom, improved to 91% with standing rest break and pursed lip breathing. O2 during ambulation maintained 86%-94% on room air and returned to 97% once seated in room at completion of session.    Follow Up Recommendations No PT follow up;Other (comment) (continue with OP therapy for rotator cuff as prior to admit)    Equipment Recommendations  None recommended by PT    Recommendations for Other Services       Precautions / Restrictions Precautions Precautions: None Restrictions Weight Bearing Restrictions: No Other Position/Activity Restrictions: rotator cuff surgery on R shoulder 07/01/16 with limited AROM      Mobility  Bed Mobility Overal bed mobility: Independent             General bed mobility comments: pt transitions supine<>sit without use of bedrails   Transfers Overall transfer level: Independent Equipment used: None             General transfer comment: Pt transfers sit<>stand and performs toilet transfer independently while managing IV pole  Ambulation/Gait Ambulation/Gait assistance: Supervision Ambulation Distance (Feet): 200 Feet Assistive device: None Gait Pattern/deviations: Step-through pattern;Narrow base of support Gait velocity: slow   General Gait Details: Pt ambulates without AD and supervision with occasional scissoring and  LOB to R/posterior with dual task, pt able to self correct without assist  Stairs            Wheelchair Mobility    Modified Rankin (Stroke Patients Only)       Balance Overall balance assessment: Needs assistance Sitting-balance support: No upper extremity supported;Feet supported Sitting balance-Leahy Scale: Normal     Standing balance support: No upper extremity supported;During functional activity Standing balance-Leahy Scale: Good Standing balance comment: Pt with occasional LOB to R/posterior but able to self correct without assist                             Pertinent Vitals/Pain Pain Assessment: No/denies pain    Home Living Family/patient expects to be discharged to:: Private residence Living Arrangements: Alone Available Help at Discharge: Family;Available PRN/intermittently Type of Home: House Home Access: Stairs to enter Entrance Stairs-Rails: Left Entrance Stairs-Number of Steps: 2 Home Layout: One level Home Equipment: Walker - 2 wheels;Bedside commode (lift chair)      Prior Function Level of Independence: Independent               Hand Dominance        Extremity/Trunk Assessment   Upper Extremity Assessment: Defer to OT evaluation           Lower Extremity Assessment: Overall WFL for tasks assessed      Cervical / Trunk Assessment: Normal  Communication   Communication: No difficulties  Cognition Arousal/Alertness: Awake/alert Behavior During Therapy: WFL for tasks assessed/performed Overall Cognitive Status: Within Functional Limits for tasks assessed  General Comments General comments (skin integrity, edema, etc.): O2 on room air as low as 82% (when exiting bathroom), but maintained between 86-100% throughout majority of session on room air.    Exercises     Assessment/Plan    PT Assessment Patent does not need any further PT services  PT Problem List            PT  Treatment Interventions      PT Goals (Current goals can be found in the Care Plan section)  Acute Rehab PT Goals Patient Stated Goal: To be able to breath better    Frequency     Barriers to discharge        Co-evaluation               End of Session Equipment Utilized During Treatment: Gait belt (pulse ox) Activity Tolerance: Patient limited by fatigue Patient left: in bed;with call bell/phone within reach;with family/visitor present Nurse Communication: Mobility status;Other (comment) (O2 saturations)         Time: [id number] PT Time Calculation (min) (ACUTE ONLY): 24 min   Charges:   PT Evaluation $PT Eval Low Complexity: 1 Procedure PT Treatments $Therapeutic Activity: 8-22 mins   PT G Codes:        Cayle Cordoba E Penven-Crew 08/31/2016, 2:51 PM

## 2016-09-01 LAB — BASIC METABOLIC PANEL
ANION GAP: 8 (ref 5–15)
BUN: 19 mg/dL (ref 6–20)
CHLORIDE: 105 mmol/L (ref 101–111)
CO2: 24 mmol/L (ref 22–32)
Calcium: 9.4 mg/dL (ref 8.9–10.3)
Creatinine, Ser: 1.28 mg/dL — ABNORMAL HIGH (ref 0.61–1.24)
GFR, EST AFRICAN AMERICAN: 60 mL/min — AB (ref >60)
GFR, EST NON AFRICAN AMERICAN: 52 mL/min — AB (ref >60)
Glucose, Bld: 92 mg/dL (ref 65–99)
POTASSIUM: 4.1 mmol/L (ref 3.5–5.1)
SODIUM: 137 mmol/L (ref 135–145)

## 2016-09-01 LAB — CBC
HCT: 47.6 % (ref 39.0–52.0)
HEMOGLOBIN: 16.5 g/dL (ref 13.0–17.0)
MCH: 32.3 pg (ref 26.0–34.0)
MCHC: 34.7 g/dL (ref 30.0–36.0)
MCV: 93.2 fL (ref 78.0–100.0)
PLATELETS: 137 10*3/uL — AB (ref 150–400)
RBC: 5.11 MIL/uL (ref 4.22–5.81)
RDW: 12.9 % (ref 11.5–15.5)
WBC: 7.9 10*3/uL (ref 4.0–10.5)

## 2016-09-01 LAB — HEPARIN LEVEL (UNFRACTIONATED)

## 2016-09-01 MED ORDER — METOPROLOL TARTRATE 25 MG PO TABS: 12.5000 mg | ORAL_TABLET | Freq: Two times a day (BID) | ORAL | 0 refills | Status: DC

## 2016-09-01 MED ORDER — APIXABAN 5 MG PO TABS: 5.0000 mg | ORAL_TABLET | Freq: Two times a day (BID) | ORAL | 1 refills | Status: DC

## 2016-09-01 MED ORDER — HYDROCODONE-ACETAMINOPHEN 5-325 MG PO TABS: 1.0000 | ORAL_TABLET | ORAL | 0 refills | Status: DC | PRN

## 2016-09-01 MED ORDER — FUROSEMIDE 80 MG PO TABS: 40.0000 mg | ORAL_TABLET | Freq: Every day | ORAL | 0 refills | Status: DC | PRN

## 2016-09-01 MED ORDER — GUAIFENESIN 200 MG PO TABS: 400.0000 mg | ORAL_TABLET | Freq: Two times a day (BID) | ORAL | 0 refills | Status: DC

## 2016-09-01 MED ORDER — APIXABAN 5 MG PO TABS: 10.0000 mg | ORAL_TABLET | Freq: Two times a day (BID) | ORAL | 0 refills | Status: DC

## 2016-09-01 NOTE — Progress Notes (Signed)
SATURATION QUALIFICATIONS:   Patient Saturations on Room Air at Rest = 95% RA  Patient Saturations on Room Air while Ambulating =86%  Patient Saturations on 2 Liters of oxygen while Ambulating = 95% 2L  Please briefly explain why patient needs home oxygen:

## 2016-09-01 NOTE — Care Management Note (Signed)
Case Management Note  Patient Details  Name: Taylor Spencer MRN: [id number] Date of Birth: 17-Jan-1935  Subjective/Objective: Pt presented for Acute Pulmonary Embolism. Pt is from home with support of children. Pt will d/c on Eliquis. 30 day free card given to patient by previous CM.                   Action/Plan: Pt qualifies for home 02. Pt has used [facility] in the past and the plan will be to utilize again. CM did make referral with [facility] with [facility]. 02 to be delivered to room before d/c. No further needs from CM at this time.   Expected Discharge Date:                  Expected Discharge Plan:  Home/Self Care  In-House Referral:  NA  Discharge planning Services  CM Consult  Post Acute Care Choice:  Durable Medical Equipment Choice offered to:  Patient  DME Arranged:  Oxygen DME Agency:  [place]:  NA [place] Agency:  NA  Status of Service:  Completed, signed off  If discussed at [place] of Stay Meetings, dates discussed:    Additional Comments:  Bethena Roys, RN 09/01/2016, 10:31 AM

## 2016-09-01 NOTE — Consult Note (Signed)
            [facility] Primary Care Navigator  09/01/2016  EAGAN BAERGA [birth year] [id number]   Wentto see patient in the room to identify possible discharge needs butstaff states that patient was already discharged.  Primary care provider's office called Maudie Mercury) and notifiedof patient's discharge and need for post hospital follow-up and transition of care. Made aware to refer patient to [facility] care management for care coordination needs if deemed appropriate.   For additional questions please contact:  Edwena Felty A. Dorisann Schwanke, BSN, RN-BC [facility] PRIMARY CARE Navigator Cell: [phone]

## 2016-09-01 NOTE — Progress Notes (Signed)
Nia A Shealy CMA  BENEFIT CHECK FOR ELIQUIS      Pt copay will be $ 24- prior autherization not required

## 2016-09-01 NOTE — Discharge Summary (Signed)
Physician Discharge Summary  Taylor Spencer [id number] DOB: 03-25-1935 DOA: 08/28/2016  PCP: Nicoletta Dress, MD  Admit date: 08/28/2016 Discharge date: 09/01/2016  Admitted From: Home  Disposition:  Home   Recommendations for Outpatient Follow-up:  1. Follow up with PCP in 1-2 weeks 2. Please obtain BMP/CBC in one week 3. Follow up with Pulmonary for repeat ECHO. And further care of PE    Discharge Condition: Stable.  CODE STATUS: Full code.  Diet recommendation: Heart Healthy  Brief/Interim Summary: Taylor Laible Blankenshipis a 81 y.o.malewith medical history significant of Hypothyroidism, HTN, A fib, history of PE 5 years ago, who presents to [facility] complaining of SOB. He relates that he was walking from his car to his apartment and he was very SOB. He got home and call his daughter, he thought he was going to died. He relates SOB just by talking. He denies chest pain. He had Rotator cuff surgery on September 26.   ED Course:Patient was transfer from [facility] for further evaluation of PE. He had A CT angio that showed submassive PE and Right heart strain. Labs from other facility, cr 1.4, hb normal.    Assessment & Plan:   Active Problems:   Acute pulmonary embolism (HCC)   Atrial fibrillation (HCC)   Hypothyroidism   Pulmonary embolism (HCC)   Chronic thoracic aortic dissection (HCC)   1--Bilateral Pulmonary Embolism, submassive with right side heart strain on CT scan;  Patient received IV heparin for  72 hour. He was subsequently transition to eliquis.  ECHO RV with mild decrease ventricular function.  and doppler LE negative for DVT, right upper positive for right axillary vein, superficial thrombosis basilic veins.  Appreciate Dr Meda Coffee help He will be discharge with oxygen.   2-A fib; continue with metoprolol, lower dose to avoid hypotension.   3-Hypothyroidism;  Continue with synthroid.   4-COPD;continue with albuterol, ipratroium  PRN.   5-Chronic Type B aortic dissection;  CVTS consulted. No further intervention needed.   6-Hemoptysis: Very small amount, maybe from bronchitis, vs PE, vs Infarct.  Monitor closely. Started guaifenesin.  Chest x ray 11-27; stable, chronic interstitial changes.  Resolved.      Discharge Diagnoses:  Active Problems:   Acute pulmonary embolism (HCC)   Atrial fibrillation (HCC)   Hypothyroidism   Pulmonary embolism (HCC)   Chronic thoracic aortic dissection [facility])    Discharge Instructions  Discharge Instructions    Diet - low sodium heart healthy    Complete by:  As directed    Increase activity slowly    Complete by:  As directed        Medication List    STOP taking these medications   aspirin EC 81 MG tablet   GINSENG EXTRACT PO   OVER THE COUNTER MEDICATION   OVER THE COUNTER MEDICATION     TAKE these medications   albuterol (2.5 MG/3ML) 0.083% nebulizer solution Commonly known as:  PROVENTIL Take 2.5 mg by nebulization every 6 (six) hours as needed for wheezing or shortness of breath.   apixaban 5 MG Tabs tablet Commonly known as:  ELIQUIS Take 2 tablets (10 mg total) by mouth every 12 (twelve) hours.   apixaban 5 MG Tabs tablet Commonly known as:  ELIQUIS Take 1 tablet (5 mg total) by mouth every 12 (twelve) hours. Start taking on:  09/08/2016   Fish Oil 1000 MG Caps Take 1 capsule by mouth.   furosemide 80 MG tablet Commonly known as:  LASIX Take 0.5 tablets (  40 mg total) by mouth daily as needed for edema. What changed:  how much to take   guaiFENesin 200 MG tablet Take 2 tablets (400 mg total) by mouth 2 (two) times daily.   HYDROcodone-acetaminophen 5-325 MG tablet Commonly known as:  NORCO/VICODIN Take 1-2 tablets by mouth every 4 (four) hours as needed for moderate pain.   levothyroxine 75 MCG tablet Commonly known as:  SYNTHROID, LEVOTHROID Take 75 mcg by mouth daily before breakfast.   metoprolol tartrate 25 MG  tablet Commonly known as:  LOPRESSOR Take 0.5 tablets (12.5 mg total) by mouth 2 (two) times daily. What changed:  how much to take   omeprazole 20 MG capsule Commonly known as:  PRILOSEC Take 20 mg by mouth daily.   vitamin B-12 250 MCG tablet Commonly known as:  CYANOCOBALAMIN Take 250 mcg by mouth daily.            Durable Medical Equipment        Start     Ordered   09/01/16 0754  For home use only DME oxygen  Once    Question Answer Comment  Mode or (Route) Nasal cannula   Liters per Minute 2   Frequency Continuous (stationary and portable oxygen unit needed)   Oxygen delivery system Gas      09/01/16 0753     Follow-up Information    Tera Partridge, MD Follow up on 09/17/2016.   Specialty:  Pulmonary Disease Why:  2:15pm  Contact information: [address] [place] [address] [phone]          No Known Allergies  Consultations: CCM  Procedures/Studies: Dg Chest 2 View  Result Date: 08/30/2016 CLINICAL DATA:  81 year old male with hemoptysis and slight shortness of breath. History of recently diagnosed sub massive peak and chronic type B thoracic aortic dissection. EXAM: CHEST  2 VIEW COMPARISON:  Prior chest x-ray 08/27/2016; prior CT chest 08/27/2016 FINDINGS: Stable cardiac and mediastinal contours. The shadow of the transverse aorta remains prominent consistent with known chronic type B dissection and ectasia. No new airspace opacity, pleural effusion or pneumothorax. Chronic interstitial prominence and bronchitic changes are again noted. No evidence of pulmonary edema. Fat containing hiatal hernia. No acute osseous abnormality. IMPRESSION: 1. No acute cardiopulmonary process. 2. Stable chronic bronchitic changes and interstitial prominence. 3. Stable ectasia of the transverse aorta in the region of known chronic [place] type B dissection. Electronically Signed   By: Jacqulynn Cadet M.D.   On: 08/30/2016 11:49   Dg Chest Portable  1 View  Result Date: 08/31/2016 CLINICAL DATA:  Shortness of breath . EXAM: PORTABLE CHEST 1 VIEW COMPARISON:  08/30/2016 . FINDINGS: Mediastinum hilar structures normal. Cardiomegaly with normal pulmonary vascularity. No focal infiltrate. No pleural effusion or pneumothorax. Degenerative changes thoracic spine. IMPRESSION: Cardiomegaly. No evidence of overt congestive heart failure. No focal infiltrate. Electronically Signed   By: Marcello Moores  Register   On: 08/31/2016 09:44       Subjective: Denies worsening dyspnea. No further hemoptysis.   Discharge Exam: Vitals:   09/01/16 0500 09/01/16 0753  BP: (!) 111/96 111/86  Pulse: 94   Resp: 20   Temp: 97.4 F (36.3 C) 97.3 F (36.3 C)   Vitals:   08/31/16 1500 09/01/16 0013 09/01/16 0500 09/01/16 0753  BP: 113/83 104/83 (!) 111/96 111/86  Pulse: 92 (!) 111 94   Resp: 19 18 20    Temp: 97.5 F (36.4 C) 98.2 F (36.8 C) 97.4 F (36.3 C) 97.3  F (36.3 C)  TempSrc: Oral Oral Oral Oral  SpO2: 92% 92% 91%   Weight:   89.4 kg (197 lb)   Height:        General: Pt is alert, awake, not in acute distress Cardiovascular: RRR, S1/S2 +, no rubs, no gallops Respiratory: CTA bilaterally, no wheezing, no rhonchi Abdominal: Soft, NT, ND, bowel sounds + Extremities: no edema, no cyanosis    The results of significant diagnostics from this hospitalization (including imaging, microbiology, ancillary and laboratory) are listed below for reference.     Microbiology: Recent Results (from the past 240 hour(s))  MRSA PCR Screening     Status: None   Collection Time: 08/28/16  5:20 PM  Result Value Ref Range Status   MRSA by PCR NEGATIVE NEGATIVE Final    Comment:        The GeneXpert MRSA Assay (FDA approved for NASAL specimens only), is one component of a comprehensive MRSA colonization surveillance program. It is not intended to diagnose MRSA infection nor to guide or monitor treatment for MRSA infections.      Labs: BNP (last 3  results) No results for input(s): BNP in the last 8760 hours. Basic Metabolic Panel:  Recent Labs Lab 08/28/16 1809 08/29/16 0506 08/30/16 0406 09/01/16 0356  NA 136 136 138 137  K 4.1 4.2 3.9 4.1  CL 100* 102 102 105  CO2 26 25 26 24   GLUCOSE 92 93 86 92  BUN 19 19 19 19   CREATININE 1.23 1.36* 1.37* 1.28*  CALCIUM 9.7 9.3 9.2 9.4  MG 2.1  --   --   --    Liver Function Tests:  Recent Labs Lab 08/29/16 0506  AST 17  ALT 15*  ALKPHOS 59  BILITOT 0.7  PROT 6.4*  ALBUMIN 3.4*   No results for input(s): LIPASE, AMYLASE in the last 168 hours. No results for input(s): AMMONIA in the last 168 hours. CBC:  Recent Labs Lab 08/28/16 1809 08/29/16 0505 08/30/16 0406 08/31/16 0553 09/01/16 0356  WBC 8.9 10.8* 9.6 9.0 7.9  HGB 17.5* 16.4 16.0 16.1 16.5  HCT 49.3 47.3 47.1 46.6 47.6  MCV 93.5 93.1 94.2 93.2 93.2  PLT 127* 128* 124* 126* 137*   Cardiac Enzymes:  Recent Labs Lab 08/28/16 1809 08/28/16 2335 08/29/16 0506  TROPONINI <0.03 <0.03 <0.03   BNP: Invalid input(s): POCBNP CBG: No results for input(s): GLUCAP in the last 168 hours. D-Dimer No results for input(s): DDIMER in the last 72 hours. Hgb A1c No results for input(s): HGBA1C in the last 72 hours. Lipid Profile No results for input(s): CHOL, HDL, LDLCALC, TRIG, CHOLHDL, LDLDIRECT in the last 72 hours. Thyroid function studies No results for input(s): TSH, T4TOTAL, T3FREE, THYROIDAB in the last 72 hours.  Invalid input(s): FREET3 Anemia work up No results for input(s): VITAMINB12, FOLATE, FERRITIN, TIBC, IRON, RETICCTPCT in the last 72 hours. Urinalysis No results found for: COLORURINE, APPEARANCEUR, [place], [place], [place], [place], [place], [place], PROTEINUR, UROBILINOGEN, NITRITE, LEUKOCYTESUR Sepsis Labs Invalid input(s): PROCALCITONIN,  WBC,  LACTICIDVEN Microbiology Recent Results (from the past 240 hour(s))  MRSA PCR Screening     Status: None   Collection Time: 08/28/16   5:20 PM  Result Value Ref Range Status   MRSA by PCR NEGATIVE NEGATIVE Final    Comment:        The GeneXpert MRSA Assay (FDA approved for NASAL specimens only), is one component of a comprehensive MRSA colonization surveillance program. It is not intended to diagnose MRSA  infection nor to guide or monitor treatment for MRSA infections.      Time coordinating discharge: Over 30 minutes  SIGNED:   Elmarie Shiley, MD  [facility] 09/01/2016, 8:07 AM Pager   If 7PM-7AM, please contact night-coverage [email or web address] Password [email or web address]

## 2016-09-01 NOTE — Discharge Instructions (Addendum)
Information on my medicine - ELIQUIS (apixaban)  This medication education was reviewed with me or my healthcare representative as part of my discharge preparation.  The pharmacist that spoke with me during my hospital stay was:  Powell, Lisa Kay, RPH  Why was Eliquis prescribed for you? Eliquis was prescribed to treat blood clots that may have been found in the veins of your legs (deep vein thrombosis) or in your lungs (pulmonary embolism) and to reduce the risk of them occurring again.  What do You need to know about Eliquis ? The starting dose is 10 mg (two 5 mg tablets) taken TWICE daily for the FIRST SEVEN (7) DAYS, then  the dose is reduced to ONE 5 mg tablet taken TWICE daily.  Eliquis may be taken with or without food.   Try to take the dose about the same time in the morning and in the evening. If you have difficulty swallowing the tablet whole please discuss with your pharmacist how to take the medication safely.  Take Eliquis exactly as prescribed and DO NOT stop taking Eliquis without talking to the doctor who prescribed the medication.  Stopping may increase your risk of developing a new blood clot.  Refill your prescription before you run out.  After discharge, you should have regular check-up appointments with your healthcare provider that is prescribing your Eliquis.    What do you do if you miss a dose? If a dose of ELIQUIS is not taken at the scheduled time, take it as soon as possible on the same day and twice-daily administration should be resumed. The dose should not be doubled to make up for a missed dose.  Important Safety Information A possible side effect of Eliquis is bleeding. You should call your healthcare provider right away if you experience any of the following: Bleeding from an injury or your nose that does not stop. Unusual colored urine (red or dark brown) or unusual colored stools (red or black). Unusual bruising for unknown reasons. A serious  fall or if you hit your head (even if there is no bleeding).  Some medicines may interact with Eliquis and might increase your risk of bleeding or clotting while on Eliquis. To help avoid this, consult your healthcare provider or pharmacist prior to using any new prescription or non-prescription medications, including herbals, vitamins, non-steroidal anti-inflammatory drugs (NSAIDs) and supplements.  This website has more information on Eliquis (apixaban): http://www.eliquis.com/eliquis/home  

## 2016-09-01 NOTE — Progress Notes (Signed)
Discharge instructions reviewed with patient. Prescriptions given to patient. Patient discharged home with son. [facility] delivered oxygen tank.

## 2016-09-01 NOTE — Care Management Important Message (Signed)
Important Message  Patient Details  Name: Taylor Spencer MRN: [id number] Date of Birth: 06/17/1935   Medicare Important Message Given:  Yes    Caroleann Casler Abena 09/01/2016, 11:45 AM

## 2016-09-05 DIAGNOSIS — I2699 Other pulmonary embolism without acute cor pulmonale: Secondary | ICD-10-CM | POA: Diagnosis not present

## 2016-09-05 DIAGNOSIS — Z23 Encounter for immunization: Secondary | ICD-10-CM | POA: Diagnosis not present

## 2016-09-05 DIAGNOSIS — Z125 Encounter for screening for malignant neoplasm of prostate: Secondary | ICD-10-CM | POA: Diagnosis not present

## 2016-09-05 DIAGNOSIS — Z9181 History of falling: Secondary | ICD-10-CM | POA: Diagnosis not present

## 2016-09-05 DIAGNOSIS — Z79899 Other long term (current) drug therapy: Secondary | ICD-10-CM | POA: Diagnosis not present

## 2016-09-05 DIAGNOSIS — J449 Chronic obstructive pulmonary disease, unspecified: Secondary | ICD-10-CM | POA: Diagnosis not present

## 2016-09-05 DIAGNOSIS — E039 Hypothyroidism, unspecified: Secondary | ICD-10-CM | POA: Diagnosis not present

## 2016-09-05 DIAGNOSIS — E785 Hyperlipidemia, unspecified: Secondary | ICD-10-CM | POA: Diagnosis not present

## 2016-09-05 DIAGNOSIS — R600 Localized edema: Secondary | ICD-10-CM | POA: Diagnosis not present

## 2016-09-05 DIAGNOSIS — I4891 Unspecified atrial fibrillation: Secondary | ICD-10-CM | POA: Diagnosis not present

## 2016-09-17 ENCOUNTER — Encounter: Payer: Self-pay | Admitting: Pulmonary Disease

## 2016-09-17 ENCOUNTER — Ambulatory Visit (INDEPENDENT_AMBULATORY_CARE_PROVIDER_SITE_OTHER): Payer: Medicare Other | Admitting: Pulmonary Disease

## 2016-09-17 ENCOUNTER — Other Ambulatory Visit (INDEPENDENT_AMBULATORY_CARE_PROVIDER_SITE_OTHER): Payer: Medicare Other

## 2016-09-17 VITALS — BP 112/80 | HR 84 | Ht 72.0 in | Wt 203.0 lb

## 2016-09-17 DIAGNOSIS — R0683 Snoring: Secondary | ICD-10-CM

## 2016-09-17 DIAGNOSIS — J439 Emphysema, unspecified: Secondary | ICD-10-CM | POA: Diagnosis not present

## 2016-09-17 DIAGNOSIS — J9601 Acute respiratory failure with hypoxia: Secondary | ICD-10-CM | POA: Diagnosis not present

## 2016-09-17 DIAGNOSIS — I2609 Other pulmonary embolism with acute cor pulmonale: Secondary | ICD-10-CM | POA: Insufficient documentation

## 2016-09-17 DIAGNOSIS — J9600 Acute respiratory failure, unspecified whether with hypoxia or hypercapnia: Secondary | ICD-10-CM | POA: Insufficient documentation

## 2016-09-17 LAB — BASIC METABOLIC PANEL
BUN: 24 mg/dL — AB (ref 6–23)
CHLORIDE: 101 meq/L (ref 96–112)
CO2: 30 meq/L (ref 19–32)
Calcium: 9.5 mg/dL (ref 8.4–10.5)
Creatinine, Ser: 1.45 mg/dL (ref 0.40–1.50)
GFR: 49.8 mL/min — ABNORMAL LOW (ref >60.00)
GLUCOSE: 93 mg/dL (ref 70–99)
POTASSIUM: 4.4 meq/L (ref 3.5–5.1)
Sodium: 139 mEq/L (ref 135–145)

## 2016-09-17 NOTE — Patient Instructions (Addendum)
   Call me if you have any new breathing problems before your next appointment.  You can use the oxygen if needed but don't need to use it at rest.   I will see you back in 2 months or sooner if needed.   We will review your test results at your follow-up appointment.  Tylenol is the only pain medication it is safe to use.  Hold off on using the Ginseng or your herbal tea for now.  It's probable safe to restart the rehab for your shoulder after the holidays.  We may need to do a CPAP titration depending on the results of your sleep study we are scheduling.  Hold off on taking any more Lasix (Furosemide).   TESTS ORDERED: 1. Serum BMP, CBC, & Alpha-1 Antitrypsin Phenotype 2. Full PFTs at next appointment 3. 6MWT on room air at next appointment 4. Polysomnogram

## 2016-09-17 NOTE — Progress Notes (Signed)
Subjective:    Patient ID: Taylor Spencer, male    DOB: July 25, 1935, 81 y.o.   MRN: [id number]  C.C.:  Follow-up for Submassive PE, Acute Hypoxic Respiratory Failure, Pulmonary Emphysema, Cor Pulmonale, Possible OSA, & Right Upper Lobe Nodule.  HPI Submassive PE: Patient suffered pulmonary embolism approximate 5 years prior to recent admission for pulmonary embolism. Transition to oral anticoagulation during hospitalization with Eliquis. He reports his breathing is improving. Denies any hemoptysis.   Acute Hypoxic Respiratory Failure:  Prescribed oxygen at 2 L/m continuous when he left the hospital. He reports he has been using it intermittently.  Pulmonary Emphysema:  He denies any coughing or wheezing.   Cor Pulmonale: Unclear etiology. Possibly secondary to acute pulmonary embolism complicated by underlying emphysema and obstructive sleep apnea. Reports he has had some mild pedal edema. He has been taking Lasix 20mg  daily for the last 3 days and his edema has resolved.   Possible OSA/Snoring:  He reports he wakes up after sleeping only 3 hours. He hasn't had any witnessed apneas. He does snore loudly. He denies any quality sleep. Does nap occasionally during the day. He reports he "jumps" and wakes up.   Right Upper Lobe Nodule: Stable by 11/24/2012 per radiology.  Review of Systems No chest pain or tightness. He reports normal bowel movements with an intermittent laxative. No melena or hematochezia. No syncope or near syncope.   No Known Allergies  Current Outpatient Prescriptions on File Prior to Visit  Medication Sig Dispense Refill  . albuterol (PROVENTIL) (2.5 MG/3ML) 0.083% nebulizer solution Take 2.5 mg by nebulization every 6 (six) hours as needed for wheezing or shortness of breath.    Marland Kitchen apixaban (ELIQUIS) 5 MG TABS tablet Take 1 tablet (5 mg total) by mouth every 12 (twelve) hours. 60 tablet 1  . furosemide (LASIX) 80 MG tablet Take 0.5 tablets (40 mg total) by mouth  daily as needed for edema. 30 tablet 0  . guaiFENesin 200 MG tablet Take 2 tablets (400 mg total) by mouth 2 (two) times daily. 30 suppository 0  . HYDROcodone-acetaminophen (NORCO/VICODIN) 5-325 MG tablet Take 1-2 tablets by mouth every 4 (four) hours as needed for moderate pain. 30 tablet 0  . levothyroxine (SYNTHROID, LEVOTHROID) 75 MCG tablet Take 75 mcg by mouth daily before breakfast.    . metoprolol tartrate (LOPRESSOR) 25 MG tablet Take 0.5 tablets (12.5 mg total) by mouth 2 (two) times daily. 60 tablet 0  . Omega-3 Fatty Acids (FISH OIL) 1000 MG CAPS Take 1 capsule by mouth.    Marland Kitchen omeprazole (PRILOSEC) 20 MG capsule Take 20 mg by mouth daily.    . vitamin B-12 (CYANOCOBALAMIN) 250 MCG tablet Take 250 mcg by mouth daily.     No current facility-administered medications on file prior to visit.     Past Medical History:  Diagnosis Date  . Atrial fibrillation ([place])    not on anticoagulation  . Barrett's esophagus   . Chronic thoracic aortic dissection (HCC)    type B noted on CTA chest  . Coronary artery disease    stent in 2010  . Emphysema of lung ([place])    seen on CT imaging  . History of colon polyps   . Hypothyroid   . Lung nodule < 6cm on CT 08/27/2016   22mm right upper lobe without change compared with previous CT 12/06/12 per radiology  . Pulmonary embolism ([place]) 2012   Treated w/ 1-2 weeks of anticoagulation & stopped due to epistaxis.  No clear cause.    Past Surgical History:  Procedure Laterality Date  . CATARACT EXTRACTION, BILATERAL  04/2016  . COLONOSCOPY WITH ESOPHAGOGASTRODUODENOSCOPY (EGD)     last colonoscopy in 2016?  . INGUINAL HERNIA REPAIR Bilateral   . INNER EAR SURGERY Left   . LEFT HEART CATH  2010   Stent Placed x1  . rotator cuff Right 06/30/2016    Family History  Problem Relation Age of Onset  . Heart attack Mother 58  . Colon cancer Mother   . Breast cancer Mother   . Diabetes Mother   . Heart attack Father 43  . Liver cancer Brother     . Breast cancer Daughter   . Breast cancer Daughter   . Breast cancer Grandchild   . Rheumatologic disease Neg Hx   . Clotting disorder Neg Hx     Social History   Social History  . Marital status: Widowed    Spouse name: N/A  . Number of children: N/A  . Years of education: N/A   Social History Main Topics  . Smoking status: Former Smoker    Packs/day: 1.00    Years: 25.00    Types: Cigarettes    Quit date: 10/06/1975  . Smokeless tobacco: Former Systems developer    Types: Chew    Quit date: 10/06/1975  . Alcohol use Yes     Comment: social   . Drug use: No  . Sexual activity: Not Asked   Other Topics Concern  . None   Social History Narrative   [facility]:    Patient is originally from [place]. Previously worked in the [employer] for approximately 26 years. 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 Prior to his shoulder surgery and cataract surgery was enjoying golfing and retirement with his children locally. Patient has made multiple car trips within a fairly recent time. Approximately 2 weeks ago he did drive to [facility] to a funeral for approximately 4 hours trip duration but did stop during this travel. Approximately 6 weeks ago he did take a driving trip to [facility] which lasted approximately 3.5-4 hours during which time he did stop. In August he took a trip to [facility] for approximately 3 hours again taking a stop. He is made to separate trips within that time to [place] approximately 5 hours in duration each reporting that he did take stops during this time as well.      Objective:   Physical Exam BP 112/80 (BP Location: Left Arm, Cuff Size: Normal)   Pulse 84   Ht 6' (1.829 m)   Wt 203 lb (92.1 kg)   SpO2 98%   BMI 27.53 kg/m  General:  Awake. Alert. No distress.  Integument:  Warm & dry. No rash on exposed skin. No bruising on exposed  skin. HEENT:  Moist mucus membranes. No oral ulcers. No scleral injection or icterus. High arching palate with neck circumference 15.5 inches. Cardiovascular:  Regular rate & rhythm. No edema. No appreciable JVD.  Pulmonary:  Good aeration & clear to auscultation bilaterally. Symmetric chest wall expansion. No accessory muscle use on room air. Abdomen: Soft. Normal bowel sounds. Nondistended. Grossly nontender. Musculoskeletal:  Normal bulk and tone. Hand grip strength 5/5 bilaterally. No joint deformity or effusion appreciated.  WALK TEST 09/17/16:  Walked 3 laps / Nadir 91% on room air   IMAGING CTA CHEST 08/28/16 (  per radiologist):  Prominent filling defects in the right and left main pulmonary arteries with extension into bilateral upper and lower lobe branch vessels. RV/LV ratio 1.6 suggesting evidence of right heart strain. No pericardial effusion. No evidence of aortic aneurysm. Eccentric mural thrombus in thoracic aorta extending from posterior arch throughout the descending aorta and into the abdominal hiatus. This is consistent with a chronic type B dissection. No change in appearance since previous study. No pathologic mediastinal adenopathy. Small esophageal hiatal hernia. Enlarged thyroid gland. Diffuse emphysematous changes.focal pulmonary nodule in right upper lobe measuring 7 mm. This is not reportedly change since study in 12/06/12. No pleural effusion or pneumothorax. Notably enlarged left thyroid lobe without change. VENOUS DUPLEX BILAT RUE 08/30/16 (per radiologist):  Nonocclusive right axillary DVT. VENOUS DUPLEX BILAT LE 08/30/16 (per radiologist):  Negative for DVT or SVT.  CARDIAC TTE (08/29/16): Mild LVH with EF 55%. Normal regional wall motion. Unable to assess diastolic function. LA normal in size & RA moderately dilated. RV moderately dilated with mildly reduced systolic function. No aortic regurgitation. Aortic root normal in size. No mitral regurgitation. Mild tricuspid  regurgitation. Trivial pulmonic regurgitation. No pericardial effusion.    Assessment & Plan:  81 y.o. male with submassive PE, pulmonary emphysema, & right upper lobe nodule. Nodule remains stable. Patient has no signs of volume overload today from his cor pulmonale. The cor pulmonale could be secondary to his underlying emphysema or even sleep disordered breathing given his history of snoring. Certainly his history of recurrent pulmonary embolism must be considered as well. He will need lifelong anticoagulation and has no signs of bleeding from his systemic anticoagulation. I did caution the patient against potential renal dysfunction with overdiuresis. He demonstrates no oxygen requirement during short walk test today but we will need to reassess his pulmonary function given his underlying emphysema as well as a more lengthy walk test at follow-up appointment. I did advise the patient he could likely start back with his shoulder rehabilitation after the holiday season while continuing systemic and coagulation. I instructed the patient to contact my office if he had any new breathing problems or questions before his next appointment.  1. Submassive PE: Continuing systemic anticoagulation with Eliquis. Patient counseled on avoiding antiplatelet agents. Will need lifelong anticoagulation. Checking BMP today & CBC with differential. 2. Pulmonary Emphysema: Screening for alpha-1 antitrypsin deficiency. Checking for pulmonary function testing at follow-up appointment. 3. Acute Hypoxic Respiratory Failure: Checking 6 minute walk test on room air at next appointment. Checking CBC today. 4. Right Upper Lobe Nodule: Per radiologist & my review stable since 2014. 5. Possible OSA/Snoring:  Checking polysomnogram. 6. Cor Pulmonale:  Plan to repeat transthoracic echocardiogram after next appointment.  Patient instructed to hold off on further diuretic therapy with Lasix. 7. Health Maintenance:  S/P Influenza Vaccine  December 2017, Prevnar 17 October 2013 & Pneumovax 27 October 2012. 8. Follow-up:  Return to clinic in 2 months or sooner if needed.   Sonia Baller Ashok Cordia, M.D. [facility] Pager:  [phone] After 3pm or if no response, call [phone] 3:02 PM 09/17/16

## 2016-09-18 LAB — CBC WITH DIFFERENTIAL/PLATELET
BASOS PCT: 0.9 % (ref 0.0–3.0)
Basophils Absolute: 0.1 10*3/uL (ref 0.0–0.1)
EOS PCT: 2.1 % (ref 0.0–5.0)
Eosinophils Absolute: 0.2 10*3/uL (ref 0.0–0.7)
HCT: 52.3 % — ABNORMAL HIGH (ref 39.0–52.0)
HEMOGLOBIN: 17.7 g/dL — AB (ref 13.0–17.0)
LYMPHS ABS: 2.4 10*3/uL (ref 0.7–4.0)
Lymphocytes Relative: 31.2 % (ref 12.0–46.0)
MCHC: 33.8 g/dL (ref 30.0–36.0)
MCV: 94.8 fl (ref 78.0–100.0)
MONO ABS: 0.7 10*3/uL (ref 0.1–1.0)
Monocytes Relative: 9.2 % (ref 3.0–12.0)
Neutro Abs: 4.4 10*3/uL (ref 1.4–7.7)
Neutrophils Relative %: 56.6 % (ref 43.0–77.0)
Platelets: 190 10*3/uL (ref 150.0–400.0)
RBC: 5.52 Mil/uL (ref 4.22–5.81)
RDW: 13.2 % (ref 11.5–15.5)
WBC: 7.7 10*3/uL (ref 4.0–10.5)

## 2016-09-21 ENCOUNTER — Telehealth: Payer: Self-pay | Admitting: Pulmonary Disease

## 2016-09-21 NOTE — Telephone Encounter (Signed)
Called and spoke to [place], [place], who saw pt in clinic today 09/21/16 regarding his rotator cuff surgery. Marylyn Ishihara is aware that JN informed the pt to hold off on PT till after holidays. Marylyn Ishihara states the pt's shoulder is very stiff. Marylyn Ishihara is questioning if ok with JN if PT can perform passive ROM on pt's shoulder.   Dr. Ashok Cordia please advise if ok to allow passive ROM for pt's post op shoulder. Thanks.

## 2016-09-21 NOTE — Telephone Encounter (Signed)
Called and spoke with Laurena Bering, PA - aware of rec's per JN. Nothing further needed.

## 2016-09-21 NOTE — Telephone Encounter (Signed)
Yes. Passive ROM is fine. Thanks.

## 2016-09-22 LAB — ALPHA-1 ANTITRYPSIN PHENOTYPE: A1 ANTITRYPSIN: 157 mg/dL (ref 83–199)

## 2016-10-01 DIAGNOSIS — R0689 Other abnormalities of breathing: Secondary | ICD-10-CM | POA: Diagnosis not present

## 2016-10-01 DIAGNOSIS — I4891 Unspecified atrial fibrillation: Secondary | ICD-10-CM | POA: Diagnosis not present

## 2016-10-06 ENCOUNTER — Ambulatory Visit (HOSPITAL_BASED_OUTPATIENT_CLINIC_OR_DEPARTMENT_OTHER): Payer: Medicare Other

## 2016-10-07 ENCOUNTER — Ambulatory Visit (HOSPITAL_BASED_OUTPATIENT_CLINIC_OR_DEPARTMENT_OTHER): Payer: Medicare Other | Attending: Pulmonary Disease | Admitting: Pulmonary Disease

## 2016-10-07 DIAGNOSIS — G4733 Obstructive sleep apnea (adult) (pediatric): Secondary | ICD-10-CM | POA: Insufficient documentation

## 2016-10-07 DIAGNOSIS — R0683 Snoring: Secondary | ICD-10-CM | POA: Diagnosis present

## 2016-10-07 DIAGNOSIS — J9601 Acute respiratory failure with hypoxia: Secondary | ICD-10-CM

## 2016-10-07 DIAGNOSIS — G4734 Idiopathic sleep related nonobstructive alveolar hypoventilation: Secondary | ICD-10-CM | POA: Diagnosis present

## 2016-10-14 DIAGNOSIS — G4733 Obstructive sleep apnea (adult) (pediatric): Secondary | ICD-10-CM | POA: Diagnosis not present

## 2016-10-14 NOTE — Procedures (Signed)
Patient Name: Taylor Spencer, Taylor Spencer Date: 10/07/2016 Gender: Male D.O.B: 11-21-1934 Age (years): 81 Referring Provider: Javier Glazier Height (inches): 41 Interpreting Physician: Kara Mead MD, ABSM Weight (lbs): 200 RPSGT: Laren Everts BMI: 27 MRN: [id number] Neck Size: 16.00  CLINICAL INFORMATION Sleep Study Type: NPSG  Indication for sleep study: COPD, Daytime Fatigue, Fatigue, Morning Headaches, Snoring  Epworth Sleepiness Score: 8     SLEEP STUDY TECHNIQUE As per the AASM Manual for the Scoring of Sleep and Associated Events v2.3 (April 2016) with a hypopnea requiring 4% desaturations.  The channels recorded and monitored were frontal, central and occipital EEG, electrooculogram (EOG), submentalis EMG (chin), nasal and oral airflow, thoracic and abdominal wall motion, anterior tibialis EMG, snore microphone, electrocardiogram, and pulse oximetry.  MEDICATIONS Medications self-administered by patient taken the night of the study : N/A  SLEEP ARCHITECTURE The study was initiated at 9:57:47 PM and ended at 4:16:39 AM.  Sleep onset time was 4.5 minutes and the sleep efficiency was 69.9%. The total sleep time was 265.0 minutes.  Stage REM latency was 236.0 minutes.  The patient spent 26.60% of the night in stage N1 sleep, 61.13% in stage N2 sleep, 0.00% in stage N3 and 12.26% in REM.  Alpha intrusion was absent.  Supine sleep was 28.49%.  RESPIRATORY PARAMETERS The overall apnea/hypopnea index (AHI) was 13.4 per hour. There were 52 total apneas, including 35 obstructive, 16 central and 1 mixed apneas. There were 7 hypopneas and 62 RERAs.  The AHI during Stage REM sleep was 7.4 per hour.  AHI while supine was 32.6 per hour.  The mean oxygen saturation was 93.17%. The minimum SpO2 during sleep was 87.00%.  Moderate snoring was noted during this study.  CARDIAC DATA The 2 lead EKG demonstrated atrial fibrillation. The mean heart rate was 80.42 beats per  minute. Other EKG findings include: None.   LEG MOVEMENT DATA The total PLMS were 129 with a resulting PLMS index of 29.21. Associated arousal with leg movement index was 4.3 .  IMPRESSIONS - Mild obstructive sleep apnea occurred during this study (AHI = 13.4/h). - No significant central sleep apnea occurred during this study (CAI = 3.6/h). - Severe oxygen desaturation was noted during this study (Min O2 = 87.00%). - The patient snored with Moderate snoring volume. - No cardiac abnormalities were noted during this study. - Moderate periodic limb movements of sleep occurred during the study. No significant associated arousals.   DIAGNOSIS - Obstructive Sleep Apnea (327.23 [G47.33 ICD-10])   RECOMMENDATIONS - Therapeutic CPAP titration to determine optimal pressure required to alleviate sleep disordered breathing. Due to large number of RERAs, RDI is in the moderate range. Would advise treatment - Positional therapy avoiding supine position during sleep. - Avoid alcohol, sedatives and other CNS depressants that may worsen sleep apnea and disrupt normal sleep architecture. - Sleep hygiene should be reviewed to assess factors that may improve sleep quality. - Weight management and regular exercise should be initiated or continued if appropriate.   Kara Mead MD Board Certified in [place]

## 2016-10-15 ENCOUNTER — Other Ambulatory Visit: Payer: Self-pay

## 2016-10-15 DIAGNOSIS — G4730 Sleep apnea, unspecified: Secondary | ICD-10-CM

## 2016-10-28 ENCOUNTER — Ambulatory Visit (HOSPITAL_BASED_OUTPATIENT_CLINIC_OR_DEPARTMENT_OTHER): Payer: Medicare Other | Attending: Pulmonary Disease | Admitting: Pulmonary Disease

## 2016-10-28 DIAGNOSIS — G4730 Sleep apnea, unspecified: Secondary | ICD-10-CM

## 2016-10-28 DIAGNOSIS — I493 Ventricular premature depolarization: Secondary | ICD-10-CM | POA: Insufficient documentation

## 2016-10-28 DIAGNOSIS — G4733 Obstructive sleep apnea (adult) (pediatric): Secondary | ICD-10-CM | POA: Diagnosis not present

## 2016-11-01 DIAGNOSIS — R0689 Other abnormalities of breathing: Secondary | ICD-10-CM | POA: Diagnosis not present

## 2016-11-01 DIAGNOSIS — I4891 Unspecified atrial fibrillation: Secondary | ICD-10-CM | POA: Diagnosis not present

## 2016-11-02 DIAGNOSIS — G4733 Obstructive sleep apnea (adult) (pediatric): Secondary | ICD-10-CM | POA: Diagnosis not present

## 2016-11-02 DIAGNOSIS — G4730 Sleep apnea, unspecified: Secondary | ICD-10-CM | POA: Diagnosis not present

## 2016-11-02 NOTE — Procedures (Signed)
Patient Name: Vicki, Pasqual Date: 10/28/2016 Gender: Male D.O.B: 04/06/1935 Age (years): 81 Referring Provider: Javier Glazier Height (inches): 72 Interpreting Physician: Kara Mead MD, ABSM Weight (lbs): 200 RPSGT: Carolin Coy BMI: 27 MRN: [id number] Neck Size: 16.00  CLINICAL INFORMATION The patient is referred for a split night study with BPAP. Most recent polysomnogram dated 10/07/2016 revealed an AHI of 13.4/h and RDI of 27.4/h.   MEDICATIONS Medications self-administered by patient taken the night of the study : N/A  SLEEP STUDY TECHNIQUE As per the AASM Manual for the Scoring of Sleep and Associated Events v2.3 (April 2016) with a hypopnea requiring 4% desaturations.  The channels recorded and monitored were frontal, central and occipital EEG, electrooculogram (EOG), submentalis EMG (chin), nasal and oral airflow, thoracic and abdominal wall motion, anterior tibialis EMG, snore microphone, electrocardiogram, and pulse oximetry. Bi-level positive airway pressure (BiPAP) was initiated when the patient met split night criteria and was titrated according to treat sleep-disordered breathing.  RESPIRATORY PARAMETERS Diagnostic  Total AHI (/hr): 9.5 RDI (/hr): 16.1 OA Index (/hr): 0.2 CA Index (/hr): 2.4 REM AHI (/hr): 4.8 NREM AHI (/hr): 11.1 Supine AHI (/hr): 61.2 Non-supine AHI (/hr): 8.71 Min O2 Sat (%): 88.00 Mean O2 (%): 92.12 Time below 88% (min): 0.1   Titration  Optimal IPAP Pressure (cm): 17 Optimal EPAP Pressure (cm): 13 AHI at Optimal Pressure (/hr): 6.3 Min O2 at Optimal Pressure (%): 90.0 Sleep % at Optimal (%): 98 Supine % at Optimal (%): 0     SLEEP ARCHITECTURE The study was initiated at 10:11:04 PM and terminated at 5:08:32 AM. The total recorded time was 417.5 minutes. EEG confirmed total sleep time was 246.0 minutes yielding a sleep efficiency of 58.9%. Sleep onset after lights out was 11.6 minutes with a REM latency of 68.5 minutes. The  patient spent 14.43% of the night in stage N1 sleep, 59.96% in stage N2 sleep, 0.00% in stage N3 and 25.61% in REM. Wake after sleep onset (WASO) was 159.9 minutes. The Arousal Index was 22.2/hour.  LEG MOVEMENT DATA The total Periodic Limb Movements of Sleep (PLMS) were 8. The PLMS index was 1.95 .  CARDIAC DATA The 2 lead EKG demonstrated atrial fibrillation. The mean heart rate was 88.21 beats per minute. Other EKG findings include: PVCs.  IMPRESSIONS - Mild obstructive sleep apnea occurred during the diagnostic portion of the study (AHI = 9.5 /hour). An optimal PAP pressure was selected for this patient ( 17 / 13cm of water). BiPAP was used due to higher pressure required & inability to tolerate high pressure on CPAP - No significant central sleep apnea occurred during the diagnostic portion of the study (CAI = 2.4/hour). - The patient snored with Soft snoring volume during the diagnostic portion of the study. - EKG findings include PVCs. - Clinically significant periodic limb movements of sleep did not occur during the study.  DIAGNOSIS - Obstructive Sleep Apnea (327.23 [G47.33 ICD-10])  RECOMMENDATIONS - Trial of autoCPAP 10-17 cm. If not tolerated, then can use BiPAP therapy on 17/13 cm H2O with a Large size Resmed Full Face Mask Mirage Quattro mask and heated humidification. - Avoid alcohol, sedatives and other CNS depressants that may worsen sleep apnea and disrupt normal sleep architecture. - Sleep hygiene should be reviewed to assess factors that may improve sleep quality. - Weight management and regular exercise should be initiated or continued. - Return to [facility] Center for re-evaluation.    Kara Mead MD Board Certified in [place]

## 2016-11-04 DIAGNOSIS — I4891 Unspecified atrial fibrillation: Secondary | ICD-10-CM | POA: Diagnosis not present

## 2016-11-04 DIAGNOSIS — J069 Acute upper respiratory infection, unspecified: Secondary | ICD-10-CM | POA: Diagnosis not present

## 2016-11-04 DIAGNOSIS — J449 Chronic obstructive pulmonary disease, unspecified: Secondary | ICD-10-CM | POA: Diagnosis not present

## 2016-11-04 DIAGNOSIS — E039 Hypothyroidism, unspecified: Secondary | ICD-10-CM | POA: Diagnosis not present

## 2016-11-04 DIAGNOSIS — M75121 Complete rotator cuff tear or rupture of right shoulder, not specified as traumatic: Secondary | ICD-10-CM | POA: Diagnosis not present

## 2016-11-04 DIAGNOSIS — I2699 Other pulmonary embolism without acute cor pulmonale: Secondary | ICD-10-CM | POA: Diagnosis not present

## 2016-11-20 ENCOUNTER — Encounter (HOSPITAL_BASED_OUTPATIENT_CLINIC_OR_DEPARTMENT_OTHER): Payer: Medicare Other

## 2016-11-26 ENCOUNTER — Telehealth: Payer: Self-pay | Admitting: Pulmonary Disease

## 2016-11-26 ENCOUNTER — Ambulatory Visit (INDEPENDENT_AMBULATORY_CARE_PROVIDER_SITE_OTHER): Payer: Medicare Other | Admitting: Pulmonary Disease

## 2016-11-26 ENCOUNTER — Encounter: Payer: Self-pay | Admitting: Pulmonary Disease

## 2016-11-26 DIAGNOSIS — J449 Chronic obstructive pulmonary disease, unspecified: Secondary | ICD-10-CM | POA: Diagnosis not present

## 2016-11-26 DIAGNOSIS — I2699 Other pulmonary embolism without acute cor pulmonale: Secondary | ICD-10-CM | POA: Diagnosis not present

## 2016-11-26 DIAGNOSIS — G4733 Obstructive sleep apnea (adult) (pediatric): Secondary | ICD-10-CM | POA: Diagnosis not present

## 2016-11-26 DIAGNOSIS — J9601 Acute respiratory failure with hypoxia: Secondary | ICD-10-CM

## 2016-11-26 DIAGNOSIS — R0602 Shortness of breath: Secondary | ICD-10-CM

## 2016-11-26 DIAGNOSIS — J439 Emphysema, unspecified: Secondary | ICD-10-CM

## 2016-11-26 DIAGNOSIS — I2609 Other pulmonary embolism with acute cor pulmonale: Secondary | ICD-10-CM

## 2016-11-26 LAB — PULMONARY FUNCTION TEST
DL/VA % PRED: 72 %
DL/VA: 3.32 ml/min/mmHg/L
DLCO COR: 21.32 ml/min/mmHg
DLCO cor % pred: 64 %
DLCO unc % pred: 65 %
DLCO unc: 21.73 ml/min/mmHg
FEF 25-75 Post: 1.74 L/sec
FEF 25-75 Pre: 1.37 L/sec
FEF2575-%CHANGE-POST: 27 %
FEF2575-%Pred-Post: 86 %
FEF2575-%Pred-Pre: 67 %
FEV1-%Change-Post: 6 %
FEV1-%PRED-PRE: 90 %
FEV1-%Pred-Post: 97 %
FEV1-PRE: 2.67 L
FEV1-Post: 2.85 L
FEV1FVC-%CHANGE-POST: 4 %
FEV1FVC-%Pred-Pre: 89 %
FEV6-%Change-Post: 3 %
FEV6-%PRED-PRE: 104 %
FEV6-%Pred-Post: 108 %
FEV6-PRE: 4.02 L
FEV6-Post: 4.18 L
FEV6FVC-%Change-Post: 1 %
FEV6FVC-%PRED-PRE: 104 %
FEV6FVC-%Pred-Post: 106 %
FVC-%Change-Post: 2 %
FVC-%PRED-POST: 103 %
FVC-%PRED-PRE: 100 %
FVC-POST: 4.24 L
FVC-PRE: 4.13 L
POST FEV6/FVC RATIO: 99 %
PRE FEV6/FVC RATIO: 97 %
Post FEV1/FVC ratio: 67 %
Pre FEV1/FVC ratio: 64 %
RV % PRED: 108 %
RV: 2.91 L
TLC % pred: 101 %
TLC: 7.28 L

## 2016-11-26 NOTE — Telephone Encounter (Signed)
LABS 09/17/16 Alpha-1 antitrypsin: MM (157) CBC: 7.7/17.7/52.3/190 BMP: 139/4.4/101/30/24/1.45/93/9.5

## 2016-11-26 NOTE — Patient Instructions (Signed)
   You can stop using the Mucinex.  Remember to check with your heart doctor to see if you need to be continued on blood thinners for your atrial fibrillation.  We may be able to take you off of your blood thinner for the clot in your lungs at the end of May.   Call me if you have any new breathing problems before your next appointment.  We will call and have the oxygen removed from your apartment because you don't need it.   I will see you back in 3 months or sooner if needed.  TESTS ORDERED: 1. Complete echocardiogram

## 2016-11-26 NOTE — Progress Notes (Signed)
Test reviewed.  

## 2016-11-26 NOTE — Progress Notes (Signed)
Subjective:    Patient ID: Taylor Spencer, male    DOB: June 01, 1935, 82 y.o.   MRN: [id number]  C.C.:  Follow-up for Acute PE w/ Cor Pulmonale, Acute Hypoxic Respiratory Failure, Mild COPD w/ Emphysema, & OSA.  HPI Acute pulmonary embolism with cor pulmonale: Currently on systemic anticoagulation with Eliquis. He denies any bleeding or easy bruising. No melena, hematochezia, or hematuria.   Acute hypoxic respiratory failure: Patient prescribed oxygen at 2 L/m after previous hospitalization. Walk test today demonstrates no evidence of oxygen requirement.  Mild COPD w/ emphysema:  He reports no breathing problems. He is back to playing golf. Reports minimal, nonproductive cough. No wheezing. He reports rare need for his rescue inhaler.   OSA: Patient exhibited mild sleep apnea during diagnostic portion of study. However, required higher pressures to achieve adequate treatment and ultimately require BiPAP. Desaturated to 88% on room air but only for 0.1 minutes.  Review of Systems Denies any chest pain or pressure. No fever or chills. No headache or acute vision changes.   No Known Allergies  Current Outpatient Prescriptions on File Prior to Visit  Medication Sig Dispense Refill  . albuterol (PROVENTIL) (2.5 MG/3ML) 0.083% nebulizer solution Take 2.5 mg by nebulization every 6 (six) hours as needed for wheezing or shortness of breath.    Marland Kitchen apixaban (ELIQUIS) 5 MG TABS tablet Take 1 tablet (5 mg total) by mouth every 12 (twelve) hours. 60 tablet 1  . furosemide (LASIX) 80 MG tablet Take 0.5 tablets (40 mg total) by mouth daily as needed for edema. 30 tablet 0  . guaiFENesin 200 MG tablet Take 2 tablets (400 mg total) by mouth 2 (two) times daily. 30 suppository 0  . HYDROcodone-acetaminophen (NORCO/VICODIN) 5-325 MG tablet Take 1-2 tablets by mouth every 4 (four) hours as needed for moderate pain. 30 tablet 0  . levothyroxine (SYNTHROID, LEVOTHROID) 75 MCG tablet Take 88 mcg by mouth  daily before breakfast.     . metoprolol tartrate (LOPRESSOR) 25 MG tablet Take 0.5 tablets (12.5 mg total) by mouth 2 (two) times daily. 60 tablet 0  . Omega-3 Fatty Acids (FISH OIL) 1000 MG CAPS Take 1 capsule by mouth.    Marland Kitchen omeprazole (PRILOSEC) 20 MG capsule Take 20 mg by mouth daily.    . vitamin B-12 (CYANOCOBALAMIN) 250 MCG tablet Take 250 mcg by mouth daily.     No current facility-administered medications on file prior to visit.     Past Medical History:  Diagnosis Date  . Atrial fibrillation ([place])    not on anticoagulation  . Barrett's esophagus   . Chronic thoracic aortic dissection ([facility])    type B noted on CTA chest  . Coronary artery disease    stent in 2010  . Emphysema of lung ([place])    seen on CT imaging  . History of colon polyps   . Hypothyroid   . Lung nodule < 6cm on CT 08/27/2016   2mm right upper lobe without change compared with previous CT 12/06/12 per radiology  . Pulmonary embolism ([place]) 2012   Treated w/ 1-2 weeks of anticoagulation & stopped due to epistaxis. No clear cause.    Past Surgical History:  Procedure Laterality Date  . CATARACT EXTRACTION, BILATERAL  04/2016  . COLONOSCOPY WITH ESOPHAGOGASTRODUODENOSCOPY (EGD)     last colonoscopy in 2016?  . INGUINAL HERNIA REPAIR Bilateral   . INNER EAR SURGERY Left   . LEFT HEART CATH  2010   Stent Placed x1  .  rotator cuff Right 06/30/2016    Family History  Problem Relation Age of Onset  . Heart attack Mother 62  . Colon cancer Mother   . Breast cancer Mother   . Diabetes Mother   . Heart attack Father 78  . Liver cancer Brother   . Breast cancer Daughter   . Breast cancer Daughter   . Breast cancer Grandchild   . Rheumatologic disease Neg Hx   . Clotting disorder Neg Hx     Social History   Social History  . Marital status: Widowed    Spouse name: N/A  . Number of children: N/A  . Years of education: N/A   Social History Main Topics  . Smoking status: Former Smoker     Packs/day: 1.00    Years: 25.00    Types: Cigarettes    Quit date: 10/06/1975  . Smokeless tobacco: Former Systems developer    Types: Chew    Quit date: 10/06/1975  . Alcohol use Yes     Comment: social   . Drug use: No  . Sexual activity: Not Asked   Other Topics Concern  . None   Social History Narrative   [facility]:    Patient is originally from [place]. Previously worked in the [employer] for approximately 26 years. 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 Prior to his shoulder surgery and cataract surgery was enjoying golfing and retirement with his children locally. Patient has made multiple car trips within a fairly recent time. Approximately 2 weeks ago he did drive to [facility] to a funeral for approximately 4 hours trip duration but did stop during this travel. Approximately 6 weeks ago he did take a driving trip to [facility] which lasted approximately 3.5-4 hours during which time he did stop. In August he took a trip to [facility] for approximately 3 hours again taking a stop. He is made to separate trips within that time to [place] approximately 5 hours in duration each reporting that he did take stops during this time as well.      Objective:   Physical Exam BP 112/80 (BP Location: Left Arm, Patient Position: Sitting, Cuff Size: Normal)   Pulse 98   Ht 6' (1.829 m)   Wt 199 lb (90.3 kg)   SpO2 93%   BMI 26.99 kg/m   Gen.: No distress. Appears comfortable. Awake. Integument: Warm and dry. No rash or bruising on exposed skin. Extremities: No cyanosis or clubbing. Abdomen: Soft. Nondistended. Nontender. Cardiovascular: Regular rate. No edema. Varicosities noted in lower extremities. Pulmonary: Good aeration & clear with auscultation bilaterally. No accessory muscle use on room air.  PFT 11/26/16: FVC 4.13 L (100%) FEV1 2.60 L (90%) FEV1/FVC 0.64 FEF  25-75 1.37 L (67%) negative bronchodilator response TLC 7.28 L (101%) RV 108% ERV 127% DLCO corrected 64%  6MWT 11/26/16:  Walked 336 meters / Baseline sat 95% on RA / Nadir Sat 95% @ rest 09/17/16:  Walked 3 laps / Nadir 91% on room air   SPLIT NIGHT SLEEP STUDY (10/28/16): Mild obstructive sleep apnea during diagnostic portion of study with AHI 9.5 events/hour. Optimal pressure for patient was BiPAP with IPAP 17cm & EPAP 13cm. patient was unable to tolerate higher pressures with CPAP. No significant central sleep apnea. Patient did have PVCs during EEG monitoring. No significant periodic leg movements.  IMAGING CTA CHEST 08/28/16 (per radiologist):  Prominent filling defects in the right and left main pulmonary arteries with extension into bilateral upper and lower lobe branch vessels. RV/LV ratio 1.6 suggesting evidence of right heart strain. No pericardial effusion. No evidence of aortic aneurysm. Eccentric mural thrombus in thoracic aorta extending from posterior arch throughout the descending aorta and into the abdominal hiatus. This is consistent with a chronic type B dissection. No change in appearance since previous study. No pathologic mediastinal adenopathy. Small esophageal hiatal hernia. Enlarged thyroid gland. Diffuse emphysematous changes.focal pulmonary nodule in right upper lobe measuring 7 mm. This is not reportedly change since study in 12/06/12. No pleural effusion or pneumothorax. Notably enlarged left thyroid lobe without change.  VENOUS DUPLEX BILAT RUE 08/30/16 (per radiologist):  Nonocclusive right axillary DVT.  VENOUS DUPLEX BILAT LE 08/30/16 (per radiologist):  Negative for DVT or SVT.  CARDIAC TTE (08/29/16): Mild LVH with EF 55%. Normal regional wall motion. Unable to assess diastolic function. LA normal in size & RA moderately dilated. RV moderately dilated with mildly reduced systolic function. No aortic regurgitation. Aortic root normal in size. No mitral regurgitation.  Mild tricuspid regurgitation. Trivial pulmonic regurgitation. No pericardial effusion.  LABS 09/17/16 Alpha-1 antitrypsin: MM (157) CBC: 7.7/17.7/52.3/190 BMP: 139/4.4/101/30/24/1.45/93/9.5    Assessment & Plan:  82 y.o. male with previous admission for submassive pulmonary embolism with acute cor pulmonale. Patient has underlying pulmonary emphysema with obstructive sleep apnea being found on polysomnogram since last appointment. Spirometry today does show mild airway obstruction without a significant bronchodilator response. Given lack of Symptoms from his underlying COPD I do not feel that additional inhalers will be of great benefit to the patient or indicated at this time. I reviewed his exposure walk test today which shows no evidence of oxygen requirement and therefore be discontinued he is on oxygen. I did discuss his polysomnogram results showing mild sleep apnea. The patient does get up frequently throughout the night to urinate with his prostate problems and would likely get limited clinical or symptomatic benefit from noninvasive positive pressure ventilation. Therefore, I will not be starting the patient on CPAP or BiPAP. We discussed his systemic anticoagulation at length which she seems to be tolerating well. We will need to reassess his acute cor pulmonale   before allowing the patient to resume his home Viagra. I instructed the patient contact my office if he had any new breathing problems or questions before his next appointment.  1.  Acute pulmonary embolism with acute cor pulmonale: Continuing on systemic anticoagulation with Eliquis. Likely will discontinue systemic anticoagulation after next appointment. Checking complete echocardiogram to evaluate function. 2. Mild COPD w/ emphysema: No indication for additional inhaler medications at this time. Continuing albuterol inhaler as needed. 3. Acute hypoxic respiratory failure: Resolved. Discontinuing home oxygen. 4. OSA: Holding off  on noninvasive positive pressure ventilation at this time. 5. Health maintenance: Status post influenza vaccine December 2017, Prevnar January 2015, & Pneumovax January 2014. 6. Follow-up: Return to clinic in 3 months or sooner if needed.  Sonia Baller Ashok Cordia, M.D. [facility] Pager:  [phone] After 3pm or if no response, call [phone] 11:12 AM 11/26/16

## 2016-11-30 DIAGNOSIS — H26493 Other secondary cataract, bilateral: Secondary | ICD-10-CM | POA: Diagnosis not present

## 2016-12-17 DIAGNOSIS — Z86711 Personal history of pulmonary embolism: Secondary | ICD-10-CM | POA: Diagnosis not present

## 2016-12-17 DIAGNOSIS — I2510 Atherosclerotic heart disease of native coronary artery without angina pectoris: Secondary | ICD-10-CM | POA: Diagnosis not present

## 2016-12-23 ENCOUNTER — Ambulatory Visit (HOSPITAL_COMMUNITY)
Admission: RE | Admit: 2016-12-23 | Discharge: 2016-12-23 | Disposition: A | Discharge status: Home / Self Care | Payer: Medicare Other | Source: Ambulatory Visit | Attending: Pulmonary Disease | Admitting: Pulmonary Disease

## 2016-12-23 DIAGNOSIS — I2699 Other pulmonary embolism without acute cor pulmonale: Secondary | ICD-10-CM | POA: Diagnosis not present

## 2016-12-23 DIAGNOSIS — I071 Rheumatic tricuspid insufficiency: Secondary | ICD-10-CM | POA: Insufficient documentation

## 2016-12-23 DIAGNOSIS — I4891 Unspecified atrial fibrillation: Secondary | ICD-10-CM | POA: Insufficient documentation

## 2016-12-23 DIAGNOSIS — J449 Chronic obstructive pulmonary disease, unspecified: Secondary | ICD-10-CM | POA: Insufficient documentation

## 2016-12-23 NOTE — Progress Notes (Signed)
  Echocardiogram 2D Echocardiogram has been performed.  Jachin Coury L Androw 12/23/2016, 9:14 AM

## 2016-12-24 NOTE — Progress Notes (Signed)
Spoke with patient and informed him of results. Pt did not have any questions. Nothing further is needed.

## 2017-02-04 DIAGNOSIS — E039 Hypothyroidism, unspecified: Secondary | ICD-10-CM | POA: Diagnosis not present

## 2017-02-04 DIAGNOSIS — I2699 Other pulmonary embolism without acute cor pulmonale: Secondary | ICD-10-CM | POA: Diagnosis not present

## 2017-02-04 DIAGNOSIS — Z79899 Other long term (current) drug therapy: Secondary | ICD-10-CM | POA: Diagnosis not present

## 2017-02-04 DIAGNOSIS — J449 Chronic obstructive pulmonary disease, unspecified: Secondary | ICD-10-CM | POA: Diagnosis not present

## 2017-02-04 DIAGNOSIS — E785 Hyperlipidemia, unspecified: Secondary | ICD-10-CM | POA: Diagnosis not present

## 2017-02-04 DIAGNOSIS — I4891 Unspecified atrial fibrillation: Secondary | ICD-10-CM | POA: Diagnosis not present

## 2017-03-05 ENCOUNTER — Ambulatory Visit (INDEPENDENT_AMBULATORY_CARE_PROVIDER_SITE_OTHER): Payer: Medicare Other | Admitting: Pulmonary Disease

## 2017-03-05 ENCOUNTER — Encounter: Payer: Self-pay | Admitting: Pulmonary Disease

## 2017-03-05 VITALS — BP 102/90 | HR 77 | Ht 72.0 in | Wt 207.4 lb

## 2017-03-05 DIAGNOSIS — G4733 Obstructive sleep apnea (adult) (pediatric): Secondary | ICD-10-CM | POA: Diagnosis not present

## 2017-03-05 DIAGNOSIS — I2699 Other pulmonary embolism without acute cor pulmonale: Secondary | ICD-10-CM

## 2017-03-05 DIAGNOSIS — J449 Chronic obstructive pulmonary disease, unspecified: Secondary | ICD-10-CM

## 2017-03-05 NOTE — Patient Instructions (Signed)
   Let me know if you have any questions or develop any new breathing problems.  I will see you back as/if needed.

## 2017-03-05 NOTE — Progress Notes (Signed)
Subjective:    Patient ID: Taylor Spencer, male    DOB: 1935/07/17, 82 y.o.   MRN: [id number]  C.C.:  Follow-up for Acute PE w/ Cor Pulmonale, Mild COPD w/ Emphysema, & OSA.  HPI Acute PE with cor pulmonale: Previously had a DVT/PE about 4-5 years ago but was only treated for 1-2 weeks with anticoagulation which was stopped due to Epistaxis. Diagnosed in November 2017 after shoulder surgery. On systemic anticoagulation with Eliquis. Still has some mild RV dilation on echocardiogram in March. He reports he is breathing "good". He denies any bleeding, melena, or hematochezia. Denies any easy bruising.   Mild COPD with emphysema: Prescribed albuterol inhaler to use as needed. He has used his rescue inhaler 2 times in the last 2 months. No exacerbations since last appointment. He does report some mild dyspnea with bending over. He is still playing golf regularly.   OSA: Mild based on diagnostic portion of sleep study. Patient did however require higher pressures to achieve adequate treatment and ultimately required BiPAP. Patient desaturated to 88% on room air for 0.1 minutes per study.   Review of Systems No chest pain or pressure. No fever or chills. No abdominal pain or nausea.   No Known Allergies  Current Outpatient Prescriptions on File Prior to Visit  Medication Sig Dispense Refill  . albuterol (PROVENTIL) (2.5 MG/3ML) 0.083% nebulizer solution Take 2.5 mg by nebulization every 6 (six) hours as needed for wheezing or shortness of breath.    Marland Kitchen apixaban (ELIQUIS) 5 MG TABS tablet Take 1 tablet (5 mg total) by mouth every 12 (twelve) hours. 60 tablet 1  . furosemide (LASIX) 80 MG tablet Take 0.5 tablets (40 mg total) by mouth daily as needed for edema. 30 tablet 0  . guaiFENesin 200 MG tablet Take 2 tablets (400 mg total) by mouth 2 (two) times daily. 30 suppository 0  . HYDROcodone-acetaminophen (NORCO/VICODIN) 5-325 MG tablet Take 1-2 tablets by mouth every 4 (four) hours as needed  for moderate pain. 30 tablet 0  . levothyroxine (SYNTHROID, LEVOTHROID) 75 MCG tablet Take 88 mcg by mouth daily before breakfast.     . metoprolol tartrate (LOPRESSOR) 25 MG tablet Take 0.5 tablets (12.5 mg total) by mouth 2 (two) times daily. 60 tablet 0  . Omega-3 Fatty Acids (FISH OIL) 1000 MG CAPS Take 1 capsule by mouth.    Marland Kitchen omeprazole (PRILOSEC) 20 MG capsule Take 20 mg by mouth daily.    . vitamin B-12 (CYANOCOBALAMIN) 250 MCG tablet Take 250 mcg by mouth daily.     No current facility-administered medications on file prior to visit.     Past Medical History:  Diagnosis Date  . Atrial fibrillation ([place])    not on anticoagulation  . Barrett's esophagus   . Chronic thoracic aortic dissection (HCC)    type B noted on CTA chest  . Coronary artery disease    stent in 2010  . Emphysema of lung ([place])    seen on CT imaging  . History of colon polyps   . Hypothyroid   . Lung nodule < 6cm on CT 08/27/2016   43mm right upper lobe without change compared with previous CT 12/06/12 per radiology  . Pulmonary embolism ([place]) 2012   Treated w/ 1-2 weeks of anticoagulation & stopped due to epistaxis. No clear cause.    Past Surgical History:  Procedure Laterality Date  . CATARACT EXTRACTION, BILATERAL  04/2016  . COLONOSCOPY WITH ESOPHAGOGASTRODUODENOSCOPY (EGD)     last  colonoscopy in 2016?  . INGUINAL HERNIA REPAIR Bilateral   . INNER EAR SURGERY Left   . LEFT HEART CATH  2010   Stent Placed x1  . rotator cuff Right 06/30/2016    Family History  Problem Relation Age of Onset  . Heart attack Mother 53  . Colon cancer Mother   . Breast cancer Mother   . Diabetes Mother   . Heart attack Father 3  . Liver cancer Brother   . Breast cancer Daughter   . Breast cancer Daughter   . Breast cancer Grandchild   . Rheumatologic disease Neg Hx   . Clotting disorder Neg Hx     Social History   Social History  . Marital status: Widowed    Spouse name: N/A  . Number of children:  N/A  . Years of education: N/A   Social History Main Topics  . Smoking status: Former Smoker    Packs/day: 1.00    Years: 25.00    Types: Cigarettes    Quit date: 10/06/1975  . Smokeless tobacco: Former Systems developer    Types: Chew    Quit date: 10/06/1975  . Alcohol use Yes     Comment: social   . Drug use: No  . Sexual activity: Not Asked   Other Topics Concern  . None   Social History Narrative   [place] [facility]:    Patient is originally from [place]. Previously worked in the [employer] for approximately 26 years. 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 Prior to his shoulder surgery and cataract surgery was enjoying golfing and retirement with his children locally. Patient has made multiple car trips within a fairly recent time. Approximately 2 weeks ago he did drive to [place] to a funeral for approximately 4 hours trip duration but did stop during this travel. Approximately 6 weeks ago he did take a driving trip to [facility] which lasted approximately 3.5-4 hours during which time he did stop. In August he took a trip to [facility] for approximately 3 hours again taking a stop. He is made to separate trips within that time to [place] approximately 5 hours in duration each reporting that he did take stops during this time as well.      Objective:   Physical Exam BP 102/90 (BP Location: Right Arm, Patient Position: Sitting, Cuff Size: Normal)   Pulse 77   Ht 6' (1.829 m)   Wt 207 lb 6.4 oz (94.1 kg)   SpO2 93%   BMI 28.13 kg/m   General:  Awake. Alert. No acute distress. Mild central obesity. Integument:  Warm & dry. No rash on exposed skin. No bruising on exposed skin. Extremities:  No cyanosis or clubbing.  HEENT:  Moist mucus membranes. No oral ulcers. No nasal turbinate swelling. Cardiovascular:  Regular rate and rhythm. No edema. Normal S1 &  S2. Pulmonary:  Clear bilaterally to auscultation. No accessory muscle use on room air. Speaking in complete sentences. Abdomen: Soft. Normal bowel sounds. Mildly protuberant. Musculoskeletal:  Normal bulk and tone. No joint deformity or effusion appreciated. Still with some decreased range of motion right shoulder.  PFT 11/26/16: FVC 4.13 L (100%) FEV1 2.60 L (90%) FEV1/FVC 0.64 FEF 25-75 1.37 L (67%) negative bronchodilator response TLC 7.28 L (101%) RV 108% ERV 127% DLCO corrected 64%  6MWT 11/26/16:  Walked 336 meters / Baseline sat 95% on RA /  Nadir Sat 95% @ rest 09/17/16:  Walked 3 laps / Nadir 91% on room air   SPLIT NIGHT SLEEP STUDY (10/28/16): Mild obstructive sleep apnea during diagnostic portion of study with AHI 9.5 events/hour. Optimal pressure for patient was BiPAP with IPAP 17cm & EPAP 13cm. patient was unable to tolerate higher pressures with CPAP. No significant central sleep apnea. Patient did have PVCs during EEG monitoring. No significant periodic leg movements.  IMAGING CTA CHEST 08/28/16 (per radiologist):  Prominent filling defects in the right and left main pulmonary arteries with extension into bilateral upper and lower lobe branch vessels. RV/LV ratio 1.6 suggesting evidence of right heart strain. No pericardial effusion. No evidence of aortic aneurysm. Eccentric mural thrombus in thoracic aorta extending from posterior arch throughout the descending aorta and into the abdominal hiatus. This is consistent with a chronic type B dissection. No change in appearance since previous study. No pathologic mediastinal adenopathy. Small esophageal hiatal hernia. Enlarged thyroid gland. Diffuse emphysematous changes.focal pulmonary nodule in right upper lobe measuring 7 mm. This is not reportedly change since study in 12/06/12. No pleural effusion or pneumothorax. Notably enlarged left thyroid lobe without change.  VENOUS DUPLEX BILAT RUE 08/30/16 (per radiologist):  Nonocclusive right  axillary DVT.  VENOUS DUPLEX BILAT LE 08/30/16 (per radiologist):  Negative for DVT or SVT.  CARDIAC TTE (08/29/16): Mild LVH with EF 55%. Normal regional wall motion. Unable to assess diastolic function. LA normal in size & RA moderately dilated. RV moderately dilated with mildly reduced systolic function. No aortic regurgitation. Aortic root normal in size. No mitral regurgitation. Mild tricuspid regurgitation. Trivial pulmonic regurgitation. No pericardial effusion.  LABS 09/17/16 Alpha-1 antitrypsin: MM (157) CBC: 7.7/17.7/52.3/190 BMP: 139/4.4/101/30/24/1.45/93/9.5    Assessment & Plan:  82 y.o. male with provoked acute pulmonary embolism with cor pulmonale, mild COPD with emphysema, and OSA. Discussed the patient's ongoing anticoagulation at length with him. Given the fact that this is his second pulmonary embolism he is concerned about a repeat event. He seems to have no signs of bleeding or adverse effects from his systemic anticoagulation. He desires to continue systemic anticoagulation for now. Overall he seems to be recovering well with minimal to no symptoms from his underlying COPD. I instructed the patient contact my office if he wishes to discuss any further or new breathing problems. I also offered to see him back as needed should he wish to discuss continuing anticoagulation given the risk benefit ratio of bleeding to recurrent clot.  1. Recurrent pulmonary embolism with acute cor pulmonale:  Patient desires to continue his Eliquis for now and will readdress continuing anticoagulation with his PCP in 3-6 months.  2. Mild COPD with emphysema:  Minimal symptoms. Continuing as needed albuterol inhaler. No new medications. 3. OSA:  Holding off on NIPPV. 4. Health maintenance: Status post influenza vaccine December 2017, Prevnar January 2015, & Pneumovax January 2014. 5. Follow-up: Return to clinic as/if needed.   Sonia Baller Ashok Cordia, M.D. [facility] Pager:   [phone] After 3pm or if no response, call [phone] 11:09 AM 03/05/17

## 2017-04-04 IMAGING — CR DG CHEST 1V PORT
1 series · 1 of 1 positions shown · non-contrast
Comparison: 08/30/2016 .

CLINICAL DATA: Shortness of breath .

EXAM:
PORTABLE CHEST 1 VIEW

[AP]
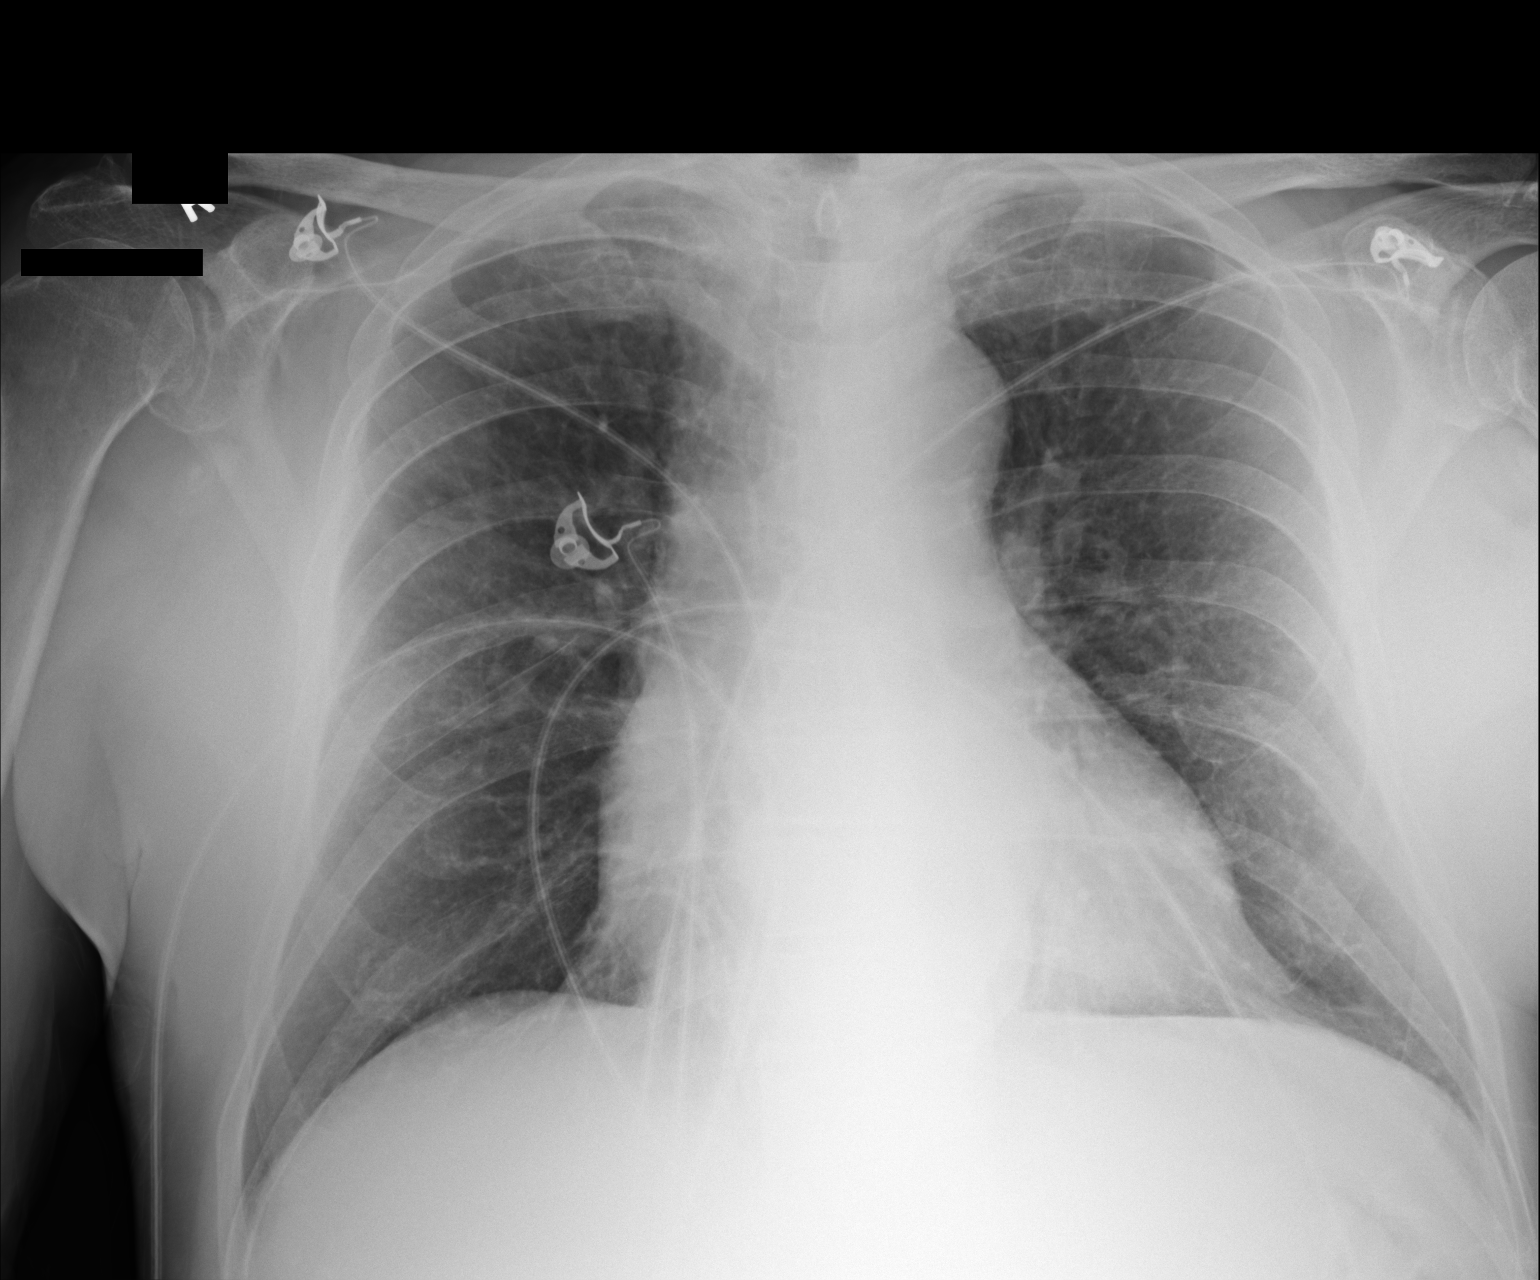

[1 of 1 positions shown; findings below may reference images not displayed]

FINDINGS: Mediastinum hilar structures normal. Cardiomegaly with normal
pulmonary vascularity. No focal infiltrate. No pleural effusion or
pneumothorax. Degenerative changes thoracic spine.
IMPRESSION: Cardiomegaly. No evidence of overt congestive heart failure. No
focal infiltrate.

## 2017-05-10 DIAGNOSIS — I4891 Unspecified atrial fibrillation: Secondary | ICD-10-CM | POA: Diagnosis not present

## 2017-05-10 DIAGNOSIS — Z1389 Encounter for screening for other disorder: Secondary | ICD-10-CM | POA: Diagnosis not present

## 2017-05-10 DIAGNOSIS — E785 Hyperlipidemia, unspecified: Secondary | ICD-10-CM | POA: Diagnosis not present

## 2017-05-10 DIAGNOSIS — I2699 Other pulmonary embolism without acute cor pulmonale: Secondary | ICD-10-CM | POA: Diagnosis not present

## 2017-05-10 DIAGNOSIS — J449 Chronic obstructive pulmonary disease, unspecified: Secondary | ICD-10-CM | POA: Diagnosis not present

## 2017-05-10 DIAGNOSIS — Z79899 Other long term (current) drug therapy: Secondary | ICD-10-CM | POA: Diagnosis not present

## 2017-05-10 DIAGNOSIS — E039 Hypothyroidism, unspecified: Secondary | ICD-10-CM | POA: Diagnosis not present

## 2017-08-10 DIAGNOSIS — E785 Hyperlipidemia, unspecified: Secondary | ICD-10-CM | POA: Diagnosis not present

## 2017-08-10 DIAGNOSIS — J449 Chronic obstructive pulmonary disease, unspecified: Secondary | ICD-10-CM | POA: Diagnosis not present

## 2017-08-10 DIAGNOSIS — I2699 Other pulmonary embolism without acute cor pulmonale: Secondary | ICD-10-CM | POA: Diagnosis not present

## 2017-08-10 DIAGNOSIS — E039 Hypothyroidism, unspecified: Secondary | ICD-10-CM | POA: Diagnosis not present

## 2017-08-10 DIAGNOSIS — I4891 Unspecified atrial fibrillation: Secondary | ICD-10-CM | POA: Diagnosis not present

## 2017-08-10 DIAGNOSIS — Z23 Encounter for immunization: Secondary | ICD-10-CM | POA: Diagnosis not present

## 2017-08-10 DIAGNOSIS — Z79899 Other long term (current) drug therapy: Secondary | ICD-10-CM | POA: Diagnosis not present

## 2017-08-23 DIAGNOSIS — Z9181 History of falling: Secondary | ICD-10-CM | POA: Diagnosis not present

## 2017-08-23 DIAGNOSIS — Z1331 Encounter for screening for depression: Secondary | ICD-10-CM | POA: Diagnosis not present

## 2017-08-23 DIAGNOSIS — Z0000 Encounter for general adult medical examination without abnormal findings: Secondary | ICD-10-CM | POA: Diagnosis not present

## 2017-08-23 DIAGNOSIS — Z136 Encounter for screening for cardiovascular disorders: Secondary | ICD-10-CM | POA: Diagnosis not present

## 2017-08-23 DIAGNOSIS — E785 Hyperlipidemia, unspecified: Secondary | ICD-10-CM | POA: Diagnosis not present

## 2017-08-23 DIAGNOSIS — Z125 Encounter for screening for malignant neoplasm of prostate: Secondary | ICD-10-CM | POA: Diagnosis not present

## 2017-11-20 DIAGNOSIS — J208 Acute bronchitis due to other specified organisms: Secondary | ICD-10-CM | POA: Diagnosis not present

## 2017-11-20 DIAGNOSIS — J019 Acute sinusitis, unspecified: Secondary | ICD-10-CM | POA: Diagnosis not present

## 2017-12-02 DIAGNOSIS — H26493 Other secondary cataract, bilateral: Secondary | ICD-10-CM | POA: Diagnosis not present

## 2017-12-20 DIAGNOSIS — K219 Gastro-esophageal reflux disease without esophagitis: Secondary | ICD-10-CM | POA: Diagnosis not present

## 2017-12-23 DIAGNOSIS — Z139 Encounter for screening, unspecified: Secondary | ICD-10-CM | POA: Diagnosis not present

## 2017-12-23 DIAGNOSIS — J449 Chronic obstructive pulmonary disease, unspecified: Secondary | ICD-10-CM | POA: Diagnosis not present

## 2017-12-23 DIAGNOSIS — I2699 Other pulmonary embolism without acute cor pulmonale: Secondary | ICD-10-CM | POA: Diagnosis not present

## 2017-12-23 DIAGNOSIS — E039 Hypothyroidism, unspecified: Secondary | ICD-10-CM | POA: Diagnosis not present

## 2017-12-23 DIAGNOSIS — E785 Hyperlipidemia, unspecified: Secondary | ICD-10-CM | POA: Diagnosis not present

## 2017-12-23 DIAGNOSIS — I4891 Unspecified atrial fibrillation: Secondary | ICD-10-CM | POA: Diagnosis not present

## 2017-12-23 DIAGNOSIS — Z79899 Other long term (current) drug therapy: Secondary | ICD-10-CM | POA: Diagnosis not present

## 2017-12-24 ENCOUNTER — Telehealth: Payer: Self-pay

## 2017-12-24 NOTE — Telephone Encounter (Signed)
Patient stated that he intends to follow Dr. Geraldo Pitter once he returns to [facility].

## 2018-01-11 DIAGNOSIS — K219 Gastro-esophageal reflux disease without esophagitis: Secondary | ICD-10-CM | POA: Diagnosis not present

## 2018-01-11 DIAGNOSIS — K2270 Barrett's esophagus without dysplasia: Secondary | ICD-10-CM | POA: Diagnosis not present

## 2018-01-11 DIAGNOSIS — K449 Diaphragmatic hernia without obstruction or gangrene: Secondary | ICD-10-CM | POA: Diagnosis not present

## 2018-01-11 DIAGNOSIS — Z79899 Other long term (current) drug therapy: Secondary | ICD-10-CM | POA: Diagnosis not present

## 2018-01-11 DIAGNOSIS — Z7901 Long term (current) use of anticoagulants: Secondary | ICD-10-CM | POA: Diagnosis not present

## 2018-01-11 DIAGNOSIS — Z87891 Personal history of nicotine dependence: Secondary | ICD-10-CM | POA: Diagnosis not present

## 2018-01-11 DIAGNOSIS — E039 Hypothyroidism, unspecified: Secondary | ICD-10-CM | POA: Diagnosis not present

## 2018-01-11 DIAGNOSIS — K210 Gastro-esophageal reflux disease with esophagitis: Secondary | ICD-10-CM | POA: Diagnosis not present

## 2018-01-11 DIAGNOSIS — Z86718 Personal history of other venous thrombosis and embolism: Secondary | ICD-10-CM | POA: Diagnosis not present

## 2018-03-07 DIAGNOSIS — J208 Acute bronchitis due to other specified organisms: Secondary | ICD-10-CM | POA: Diagnosis not present

## 2018-03-14 DIAGNOSIS — B029 Zoster without complications: Secondary | ICD-10-CM | POA: Diagnosis not present

## 2018-03-28 DIAGNOSIS — J449 Chronic obstructive pulmonary disease, unspecified: Secondary | ICD-10-CM | POA: Diagnosis not present

## 2018-03-28 DIAGNOSIS — E785 Hyperlipidemia, unspecified: Secondary | ICD-10-CM | POA: Diagnosis not present

## 2018-03-28 DIAGNOSIS — I4891 Unspecified atrial fibrillation: Secondary | ICD-10-CM | POA: Diagnosis not present

## 2018-03-28 DIAGNOSIS — E039 Hypothyroidism, unspecified: Secondary | ICD-10-CM | POA: Diagnosis not present

## 2018-03-28 DIAGNOSIS — I2699 Other pulmonary embolism without acute cor pulmonale: Secondary | ICD-10-CM | POA: Diagnosis not present

## 2018-03-28 DIAGNOSIS — Z79899 Other long term (current) drug therapy: Secondary | ICD-10-CM | POA: Diagnosis not present

## 2018-03-30 ENCOUNTER — Telehealth: Payer: Self-pay

## 2018-03-30 NOTE — Telephone Encounter (Signed)
Called and talked to patient and he states he will call up to the front desk and set an appointment.

## 2018-05-05 DIAGNOSIS — B0229 Other postherpetic nervous system involvement: Secondary | ICD-10-CM | POA: Diagnosis not present

## 2018-05-05 DIAGNOSIS — I9589 Other hypotension: Secondary | ICD-10-CM | POA: Diagnosis not present

## 2018-05-05 DIAGNOSIS — J449 Chronic obstructive pulmonary disease, unspecified: Secondary | ICD-10-CM | POA: Diagnosis not present

## 2018-05-07 DIAGNOSIS — I2510 Atherosclerotic heart disease of native coronary artery without angina pectoris: Secondary | ICD-10-CM

## 2018-05-07 DIAGNOSIS — K219 Gastro-esophageal reflux disease without esophagitis: Secondary | ICD-10-CM

## 2018-05-07 DIAGNOSIS — E039 Hypothyroidism, unspecified: Secondary | ICD-10-CM

## 2018-05-07 DIAGNOSIS — R079 Chest pain, unspecified: Secondary | ICD-10-CM | POA: Diagnosis not present

## 2018-05-07 DIAGNOSIS — R0609 Other forms of dyspnea: Secondary | ICD-10-CM

## 2018-05-07 DIAGNOSIS — E872 Acidosis: Secondary | ICD-10-CM

## 2018-05-07 DIAGNOSIS — I4891 Unspecified atrial fibrillation: Secondary | ICD-10-CM | POA: Diagnosis not present

## 2018-05-07 DIAGNOSIS — D751 Secondary polycythemia: Secondary | ICD-10-CM | POA: Diagnosis not present

## 2018-05-07 DIAGNOSIS — J441 Chronic obstructive pulmonary disease with (acute) exacerbation: Secondary | ICD-10-CM | POA: Diagnosis not present

## 2018-05-07 DIAGNOSIS — R0902 Hypoxemia: Secondary | ICD-10-CM | POA: Diagnosis not present

## 2018-05-07 DIAGNOSIS — D6859 Other primary thrombophilia: Secondary | ICD-10-CM

## 2018-05-07 DIAGNOSIS — R0602 Shortness of breath: Secondary | ICD-10-CM | POA: Diagnosis not present

## 2018-05-07 DIAGNOSIS — Z86711 Personal history of pulmonary embolism: Secondary | ICD-10-CM

## 2018-05-08 DIAGNOSIS — R0902 Hypoxemia: Secondary | ICD-10-CM | POA: Diagnosis not present

## 2018-05-08 DIAGNOSIS — D6859 Other primary thrombophilia: Secondary | ICD-10-CM | POA: Diagnosis not present

## 2018-05-08 DIAGNOSIS — J441 Chronic obstructive pulmonary disease with (acute) exacerbation: Secondary | ICD-10-CM | POA: Diagnosis not present

## 2018-05-08 DIAGNOSIS — Z7902 Long term (current) use of antithrombotics/antiplatelets: Secondary | ICD-10-CM | POA: Diagnosis not present

## 2018-05-08 DIAGNOSIS — I4891 Unspecified atrial fibrillation: Secondary | ICD-10-CM | POA: Diagnosis not present

## 2018-05-08 DIAGNOSIS — I2510 Atherosclerotic heart disease of native coronary artery without angina pectoris: Secondary | ICD-10-CM | POA: Diagnosis not present

## 2018-05-08 DIAGNOSIS — J449 Chronic obstructive pulmonary disease, unspecified: Secondary | ICD-10-CM | POA: Diagnosis not present

## 2018-05-08 DIAGNOSIS — Z87891 Personal history of nicotine dependence: Secondary | ICD-10-CM | POA: Diagnosis not present

## 2018-05-08 DIAGNOSIS — R0609 Other forms of dyspnea: Secondary | ICD-10-CM | POA: Diagnosis not present

## 2018-05-08 DIAGNOSIS — I482 Chronic atrial fibrillation: Secondary | ICD-10-CM | POA: Diagnosis not present

## 2018-05-08 DIAGNOSIS — Z86718 Personal history of other venous thrombosis and embolism: Secondary | ICD-10-CM | POA: Diagnosis not present

## 2018-05-08 DIAGNOSIS — K219 Gastro-esophageal reflux disease without esophagitis: Secondary | ICD-10-CM | POA: Diagnosis not present

## 2018-05-08 DIAGNOSIS — Z955 Presence of coronary angioplasty implant and graft: Secondary | ICD-10-CM | POA: Diagnosis not present

## 2018-05-08 DIAGNOSIS — K2270 Barrett's esophagus without dysplasia: Secondary | ICD-10-CM | POA: Diagnosis not present

## 2018-05-08 DIAGNOSIS — E039 Hypothyroidism, unspecified: Secondary | ICD-10-CM | POA: Diagnosis not present

## 2018-05-08 DIAGNOSIS — R0602 Shortness of breath: Secondary | ICD-10-CM | POA: Diagnosis not present

## 2018-05-08 DIAGNOSIS — Z7901 Long term (current) use of anticoagulants: Secondary | ICD-10-CM

## 2018-05-08 DIAGNOSIS — Z79899 Other long term (current) drug therapy: Secondary | ICD-10-CM | POA: Diagnosis not present

## 2018-05-08 DIAGNOSIS — D751 Secondary polycythemia: Secondary | ICD-10-CM | POA: Diagnosis not present

## 2018-05-08 DIAGNOSIS — R079 Chest pain, unspecified: Secondary | ICD-10-CM | POA: Diagnosis not present

## 2018-05-08 DIAGNOSIS — Z86711 Personal history of pulmonary embolism: Secondary | ICD-10-CM | POA: Diagnosis not present

## 2018-05-13 ENCOUNTER — Ambulatory Visit: Payer: Medicare Other | Admitting: Cardiology

## 2018-05-13 ENCOUNTER — Encounter: Payer: Self-pay | Admitting: Cardiology

## 2018-05-13 VITALS — BP 122/70 | HR 91 | Ht 72.0 in | Wt 200.0 lb

## 2018-05-13 DIAGNOSIS — I4891 Unspecified atrial fibrillation: Secondary | ICD-10-CM

## 2018-05-13 DIAGNOSIS — I7101 Dissection of thoracic aorta: Secondary | ICD-10-CM | POA: Diagnosis not present

## 2018-05-13 DIAGNOSIS — I71019 Dissection of thoracic aorta, unspecified: Secondary | ICD-10-CM

## 2018-05-13 DIAGNOSIS — Z86711 Personal history of pulmonary embolism: Secondary | ICD-10-CM

## 2018-05-13 DIAGNOSIS — E039 Hypothyroidism, unspecified: Secondary | ICD-10-CM | POA: Diagnosis not present

## 2018-05-13 DIAGNOSIS — J449 Chronic obstructive pulmonary disease, unspecified: Secondary | ICD-10-CM | POA: Diagnosis not present

## 2018-05-13 MED ORDER — METOPROLOL TARTRATE 25 MG PO TABS: 75.0000 mg | ORAL_TABLET | Freq: Every day | ORAL | 2 refills | Status: DC

## 2018-05-13 NOTE — Patient Instructions (Signed)
Medication Instructions:  Your physician recommends that you continue on your current medications as directed. Please refer to the Current Medication list given to you today.  Labwork: None  Testing/Procedures: None  Follow-Up: Your physician recommends that you schedule a follow-up appointment in: 6 months  Any Other Special Instructions Will Be Listed Below (If Applicable).     If you need a refill on your cardiac medications before your next appointment, please call your pharmacy.   [facility]  Jynesis Nakamura A, RN, BSN  

## 2018-05-13 NOTE — Progress Notes (Signed)
Cardiology Office Note:    Date:  05/13/2018   ID:  CURLY MACKOWSKI, DOB 04-27-1935, MRN [id number]  PCP:  Nicoletta Dress, MD  Cardiologist:  Jenean Lindau, MD   Referring MD: Nicoletta Dress, MD    ASSESSMENT:    1. Atrial fibrillation, unspecified type ([place])   2. Chronic thoracic aortic dissection (HCC)   3. History of pulmonary embolism    PLAN:    In order of problems listed above:  1. I discussed my findings with the patient at extensive length.  Secondary prevention stressed.  Importance of compliance with diet and medication stressed and he vocalized understanding. 2. I discussed with the patient atrial fibrillation, disease process. Management and therapy including rate and rhythm control, anticoagulation benefits and potential risks were discussed extensively with the patient. Patient had multiple questions which were answered to patient's satisfaction. 3. Patient will continue current medications.  His heart rate is stable he will be seen in follow-up appointment in 6 months or earlier if he has any concerns.    Medication Adjustments/Labs and Tests Ordered: Current medicines are reviewed at length with the patient today.  Concerns regarding medicines are outlined above.  No orders of the defined types were placed in this encounter.  No orders of the defined types were placed in this encounter.    No chief complaint on file.    History of Present Illness:    Taylor Spencer is a 83 y.o. male.  Patient is a pleasant gentleman with history of atrial fibrillation.  He is on anticoagulation.  He mentions to me that he recently went to the hospital and was evaluated up there.  He saw my partner.  The patient is on anticoagulation he was told that his heart rates were elevated.  I reviewed [facility] hospital records.  He is here for follow-up at this time.  He denies any chest pain orthopnea or PND.  She has brought records of his pulse and blood pressure  checks at home and his heart rates are good but they can be optimized for that I feel.  Past Medical History:  Diagnosis Date  . Atrial fibrillation ([place])    not on anticoagulation  . Barrett's esophagus   . Chronic thoracic aortic dissection (HCC)    type B noted on CTA chest  . Coronary artery disease    stent in 2010  . Emphysema of lung ([place])    seen on CT imaging  . History of colon polyps   . Hypothyroid   . Lung nodule < 6cm on CT 08/27/2016   70mm right upper lobe without change compared with previous CT 12/06/12 per radiology  . Pulmonary embolism ([place]) 2012   Treated w/ 1-2 weeks of anticoagulation & stopped due to epistaxis. No clear cause.    Past Surgical History:  Procedure Laterality Date  . CATARACT EXTRACTION, BILATERAL  04/2016  . COLONOSCOPY WITH ESOPHAGOGASTRODUODENOSCOPY (EGD)     last colonoscopy in 2016?  . INGUINAL HERNIA REPAIR Bilateral   . INNER EAR SURGERY Left   . LEFT HEART CATH  2010   Stent Placed x1  . rotator cuff Right 06/30/2016    Current Medications: Current Meds  Medication Sig  . albuterol (PROVENTIL) (2.5 MG/3ML) 0.083% nebulizer solution Take 2.5 mg by nebulization every 6 (six) hours as needed for wheezing or shortness of breath.  Marland Kitchen apixaban (ELIQUIS) 5 MG TABS tablet Take 5 mg by mouth 2 (two) times daily.  Marland Kitchen  b complex vitamins tablet Take by mouth.  . Cyanocobalamin (VITAMIN B 12) 250 MCG LOZG Take 1 tablet by mouth daily.  . furosemide (LASIX) 80 MG tablet Take 80 mg by mouth as needed.  Marland Kitchen levothyroxine (SYNTHROID, LEVOTHROID) 88 MCG tablet Take 88 mcg by mouth daily.  . metoprolol tartrate (LOPRESSOR) 25 MG tablet Take 0.5 tablets (12.5 mg total) by mouth 2 (two) times daily.  Marland Kitchen omeprazole (PRILOSEC) 20 MG capsule Take 20 mg by mouth daily.     Allergies:   Patient has no known allergies.   Social History   Socioeconomic History  . Marital status: Widowed    Spouse name: Not on file  . Number of children: Not on file  .  Years of education: Not on file  . Highest education level: Not on file  Occupational History  . Not on file  Social Needs  . Financial resource strain: Not on file  . Food insecurity:    Worry: Not on file    Inability: Not on file  . Transportation needs:    Medical: Not on file    Non-medical: Not on file  Tobacco Use  . Smoking status: Former Smoker    Packs/day: 1.00    Years: 25.00    Pack years: 25.00    Types: Cigarettes    Last attempt to quit: 10/06/1975    Years since quitting: 42.6  . Smokeless tobacco: Former Systems developer    Types: [place] date: 10/06/1975  Substance and Sexual Activity  . Alcohol use: Yes    Comment: social   . Drug use: No  . Sexual activity: Not on file  Lifestyle  . Physical activity:    Days per week: Not on file    Minutes per session: Not on file  . Stress: Not on file  Relationships  . Social connections:    Talks on phone: Not on file    Gets together: Not on file    Attends religious service: Not on file    Active member of club or organization: Not on file    Attends meetings of clubs or organizations: Not on file    Relationship status: Not on file  Other Topics Concern  . Not on file  Social History Narrative   [facility] [place] [address] [facility]:    Patient is originally from [facility]. Previously worked in the [employer] for approximately 26 years. 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 Prior to his shoulder surgery and cataract surgery was enjoying golfing and retirement with his children locally. Patient has made multiple car trips within a fairly recent time. Approximately 2 weeks ago he did drive to [facility] to a funeral for approximately 4 hours trip duration but did stop during this travel. Approximately 6 weeks ago he did take a driving trip to [facility] which lasted approximately 3.5-4 hours during which time he did  stop. In August he took a trip to [facility] for approximately 3 hours again taking a stop. He is made to separate trips within that time to [place] approximately 5 hours in duration each reporting that he did take stops during this time as well.     Family History: The patient's family history includes Breast cancer in his daughter, daughter, grandchild, and mother; Colon cancer in his mother; Diabetes in his mother; Heart attack (age of onset: 58) in his  father; Heart attack (age of onset: 26) in his mother; Liver cancer in his brother. There is no history of Rheumatologic disease or Clotting disorder.  ROS:   Please see the history of present illness.    All other systems reviewed and are negative.  EKGs/Labs/Other Studies Reviewed:    The following studies were reviewed today: [facility] records were reviewed.   Recent Labs: No results found for requested labs within last 8760 hours.  Recent Lipid Panel No results found for: CHOL, TRIG, HDL, CHOLHDL, VLDL, LDLCALC, LDLDIRECT  Physical Exam:    VS:  BP 122/70 (BP Location: Right Arm, Patient Position: Sitting, Cuff Size: Normal)   Pulse 91   Ht 6' (1.829 m)   Wt 200 lb (90.7 kg)   SpO2 98%   BMI 27.12 kg/m     Wt Readings from Last 3 Encounters:  05/13/18 200 lb (90.7 kg)  03/05/17 207 lb 6.4 oz (94.1 kg)  11/26/16 199 lb (90.3 kg)     GEN: Patient is in no acute distress HEENT: Normal NECK: No JVD; No carotid bruits LYMPHATICS: No lymphadenopathy CARDIAC: Hear sounds regular, 2/6 systolic murmur at the apex. RESPIRATORY:  Clear to auscultation without rales, wheezing or rhonchi  ABDOMEN: Soft, non-tender, non-distended MUSCULOSKELETAL:  No edema; No deformity  SKIN: Warm and dry NEUROLOGIC:  Alert and oriented x 3 PSYCHIATRIC:  Normal affect   Signed, Jenean Lindau, MD  05/13/2018 11:36 AM    [place]

## 2018-05-13 NOTE — Addendum Note (Signed)
Addended by: Mattie Marlin on: 05/13/2018 12:00 PM   Modules accepted: Orders

## 2018-07-01 DIAGNOSIS — I2699 Other pulmonary embolism without acute cor pulmonale: Secondary | ICD-10-CM | POA: Diagnosis not present

## 2018-07-01 DIAGNOSIS — Z23 Encounter for immunization: Secondary | ICD-10-CM | POA: Diagnosis not present

## 2018-07-01 DIAGNOSIS — E785 Hyperlipidemia, unspecified: Secondary | ICD-10-CM | POA: Diagnosis not present

## 2018-07-01 DIAGNOSIS — I4891 Unspecified atrial fibrillation: Secondary | ICD-10-CM | POA: Diagnosis not present

## 2018-07-01 DIAGNOSIS — Z79899 Other long term (current) drug therapy: Secondary | ICD-10-CM | POA: Diagnosis not present

## 2018-07-01 DIAGNOSIS — E039 Hypothyroidism, unspecified: Secondary | ICD-10-CM | POA: Diagnosis not present

## 2018-07-01 DIAGNOSIS — J449 Chronic obstructive pulmonary disease, unspecified: Secondary | ICD-10-CM | POA: Diagnosis not present

## 2018-07-21 DIAGNOSIS — M792 Neuralgia and neuritis, unspecified: Secondary | ICD-10-CM | POA: Diagnosis not present

## 2018-07-28 ENCOUNTER — Other Ambulatory Visit: Payer: Self-pay

## 2018-07-28 NOTE — Patient Outreach (Signed)
[facility]) Care Management  07/28/2018  Taylor Spencer November 21, 1934 [id number]   Medication Adherence call to [name] he is no longer taking Pravastatin 20 mg because of side effects doctor took him off. [name] is showing past due under [place].   [facility] Management Direct Dial [phone]  Fax [phone] [email or web address]

## 2018-08-25 DIAGNOSIS — E785 Hyperlipidemia, unspecified: Secondary | ICD-10-CM | POA: Diagnosis not present

## 2018-08-25 DIAGNOSIS — Z139 Encounter for screening, unspecified: Secondary | ICD-10-CM | POA: Diagnosis not present

## 2018-08-25 DIAGNOSIS — Z0000 Encounter for general adult medical examination without abnormal findings: Secondary | ICD-10-CM | POA: Diagnosis not present

## 2018-08-25 DIAGNOSIS — Z9181 History of falling: Secondary | ICD-10-CM | POA: Diagnosis not present

## 2018-09-12 DIAGNOSIS — M159 Polyosteoarthritis, unspecified: Secondary | ICD-10-CM | POA: Diagnosis not present

## 2018-09-30 DIAGNOSIS — E039 Hypothyroidism, unspecified: Secondary | ICD-10-CM | POA: Diagnosis not present

## 2018-09-30 DIAGNOSIS — I2699 Other pulmonary embolism without acute cor pulmonale: Secondary | ICD-10-CM | POA: Diagnosis not present

## 2018-09-30 DIAGNOSIS — Z79899 Other long term (current) drug therapy: Secondary | ICD-10-CM | POA: Diagnosis not present

## 2018-09-30 DIAGNOSIS — E785 Hyperlipidemia, unspecified: Secondary | ICD-10-CM | POA: Diagnosis not present

## 2018-09-30 DIAGNOSIS — J449 Chronic obstructive pulmonary disease, unspecified: Secondary | ICD-10-CM | POA: Diagnosis not present

## 2018-09-30 DIAGNOSIS — I4891 Unspecified atrial fibrillation: Secondary | ICD-10-CM | POA: Diagnosis not present

## 2018-11-17 DIAGNOSIS — H6692 Otitis media, unspecified, left ear: Secondary | ICD-10-CM | POA: Diagnosis not present

## 2019-01-03 DIAGNOSIS — I2699 Other pulmonary embolism without acute cor pulmonale: Secondary | ICD-10-CM | POA: Diagnosis not present

## 2019-01-03 DIAGNOSIS — J449 Chronic obstructive pulmonary disease, unspecified: Secondary | ICD-10-CM | POA: Diagnosis not present

## 2019-01-03 DIAGNOSIS — Z79899 Other long term (current) drug therapy: Secondary | ICD-10-CM | POA: Diagnosis not present

## 2019-01-03 DIAGNOSIS — E039 Hypothyroidism, unspecified: Secondary | ICD-10-CM | POA: Diagnosis not present

## 2019-01-03 DIAGNOSIS — I4891 Unspecified atrial fibrillation: Secondary | ICD-10-CM | POA: Diagnosis not present

## 2019-01-03 DIAGNOSIS — E785 Hyperlipidemia, unspecified: Secondary | ICD-10-CM | POA: Diagnosis not present

## 2019-03-03 DIAGNOSIS — Z961 Presence of intraocular lens: Secondary | ICD-10-CM | POA: Diagnosis not present

## 2019-03-03 DIAGNOSIS — H353121 Nonexudative age-related macular degeneration, left eye, early dry stage: Secondary | ICD-10-CM | POA: Diagnosis not present

## 2019-04-11 DIAGNOSIS — E039 Hypothyroidism, unspecified: Secondary | ICD-10-CM | POA: Diagnosis not present

## 2019-04-11 DIAGNOSIS — E785 Hyperlipidemia, unspecified: Secondary | ICD-10-CM | POA: Diagnosis not present

## 2019-04-11 DIAGNOSIS — I4891 Unspecified atrial fibrillation: Secondary | ICD-10-CM | POA: Diagnosis not present

## 2019-04-11 DIAGNOSIS — I2699 Other pulmonary embolism without acute cor pulmonale: Secondary | ICD-10-CM | POA: Diagnosis not present

## 2019-04-11 DIAGNOSIS — J449 Chronic obstructive pulmonary disease, unspecified: Secondary | ICD-10-CM | POA: Diagnosis not present

## 2019-04-11 DIAGNOSIS — Z79899 Other long term (current) drug therapy: Secondary | ICD-10-CM | POA: Diagnosis not present

## 2019-04-20 DIAGNOSIS — D751 Secondary polycythemia: Secondary | ICD-10-CM | POA: Diagnosis not present

## 2019-04-20 DIAGNOSIS — D649 Anemia, unspecified: Secondary | ICD-10-CM | POA: Diagnosis not present

## 2019-05-04 DIAGNOSIS — D751 Secondary polycythemia: Secondary | ICD-10-CM | POA: Diagnosis not present

## 2019-05-25 DIAGNOSIS — D751 Secondary polycythemia: Secondary | ICD-10-CM | POA: Diagnosis not present

## 2019-07-12 DIAGNOSIS — E039 Hypothyroidism, unspecified: Secondary | ICD-10-CM | POA: Diagnosis not present

## 2019-07-12 DIAGNOSIS — E785 Hyperlipidemia, unspecified: Secondary | ICD-10-CM | POA: Diagnosis not present

## 2019-07-12 DIAGNOSIS — Z79899 Other long term (current) drug therapy: Secondary | ICD-10-CM | POA: Diagnosis not present

## 2019-07-12 DIAGNOSIS — J449 Chronic obstructive pulmonary disease, unspecified: Secondary | ICD-10-CM | POA: Diagnosis not present

## 2019-07-12 DIAGNOSIS — Z23 Encounter for immunization: Secondary | ICD-10-CM | POA: Diagnosis not present

## 2019-07-12 DIAGNOSIS — I4891 Unspecified atrial fibrillation: Secondary | ICD-10-CM | POA: Diagnosis not present

## 2019-08-29 DIAGNOSIS — Z9181 History of falling: Secondary | ICD-10-CM | POA: Diagnosis not present

## 2019-08-29 DIAGNOSIS — E785 Hyperlipidemia, unspecified: Secondary | ICD-10-CM | POA: Diagnosis not present

## 2019-08-29 DIAGNOSIS — Z0000 Encounter for general adult medical examination without abnormal findings: Secondary | ICD-10-CM | POA: Diagnosis not present

## 2019-08-29 DIAGNOSIS — Z139 Encounter for screening, unspecified: Secondary | ICD-10-CM | POA: Diagnosis not present

## 2019-10-30 DIAGNOSIS — Z7189 Other specified counseling: Secondary | ICD-10-CM | POA: Diagnosis not present

## 2019-11-09 DIAGNOSIS — Z79899 Other long term (current) drug therapy: Secondary | ICD-10-CM | POA: Diagnosis not present

## 2019-11-09 DIAGNOSIS — Z86711 Personal history of pulmonary embolism: Secondary | ICD-10-CM | POA: Diagnosis not present

## 2019-11-09 DIAGNOSIS — I4891 Unspecified atrial fibrillation: Secondary | ICD-10-CM | POA: Diagnosis not present

## 2019-11-09 DIAGNOSIS — E785 Hyperlipidemia, unspecified: Secondary | ICD-10-CM | POA: Diagnosis not present

## 2019-11-09 DIAGNOSIS — E039 Hypothyroidism, unspecified: Secondary | ICD-10-CM | POA: Diagnosis not present

## 2019-11-09 DIAGNOSIS — J449 Chronic obstructive pulmonary disease, unspecified: Secondary | ICD-10-CM | POA: Diagnosis not present

## 2019-11-14 ENCOUNTER — Encounter: Payer: Self-pay | Admitting: Cardiology

## 2019-11-14 ENCOUNTER — Ambulatory Visit (INDEPENDENT_AMBULATORY_CARE_PROVIDER_SITE_OTHER): Payer: Medicare Other | Admitting: Cardiology

## 2019-11-14 ENCOUNTER — Other Ambulatory Visit: Payer: Self-pay

## 2019-11-14 VITALS — BP 120/80 | HR 81 | Ht 72.0 in | Wt 208.0 lb

## 2019-11-14 DIAGNOSIS — Z86711 Personal history of pulmonary embolism: Secondary | ICD-10-CM

## 2019-11-14 DIAGNOSIS — R011 Cardiac murmur, unspecified: Secondary | ICD-10-CM

## 2019-11-14 DIAGNOSIS — I4821 Permanent atrial fibrillation: Secondary | ICD-10-CM

## 2019-11-14 NOTE — Patient Instructions (Signed)
Medication Instructions:  Your physician recommends that you continue on your current medications as directed. Please refer to the Current Medication list given to you today.  *If you need a refill on your cardiac medications before your next appointment, please call your pharmacy*  Lab Work: NONE If you have labs (blood work) drawn today and your tests are completely normal, you will receive your results only by: Marland Kitchen MyChart Message (if you have MyChart) OR . A paper copy in the mail If you have any lab test that is abnormal or we need to change your treatment, we will call you to review the results.  Testing/Procedures: Your physician has requested that you have an echocardiogram. Echocardiography is a painless test that uses sound waves to create images of your heart. It provides your doctor with information about the size and shape of your heart and how well your heart's chambers and valves are working. This procedure takes approximately one hour. There are no restrictions for this procedure.  Follow-Up: At [facility], you and your health needs are our priority.  As part of our continuing mission to provide you with exceptional heart care, we have created designated Provider Care Teams.  These Care Teams include your primary Cardiologist (physician) and Advanced Practice Providers (APPs -  Physician Assistants and Nurse Practitioners) who all work together to provide you with the care you need, when you need it.  Your next appointment:   6 month(s)  The format for your next appointment:   In Person  Provider:   Jyl Heinz, MD  Other Instructions   Echocardiogram An echocardiogram is a procedure that uses painless sound waves (ultrasound) to produce an image of the heart. Images from an echocardiogram can provide important information about:  Signs of coronary artery disease (CAD).  Aneurysm detection. An aneurysm is a weak or damaged part of an artery wall that bulges out  from the normal force of blood pumping through the body.  Heart size and shape. Changes in the size or shape of the heart can be associated with certain conditions, including heart failure, aneurysm, and CAD.  Heart muscle function.  Heart valve function.  Signs of a past heart attack.  Fluid buildup around the heart.  Thickening of the heart muscle.  A tumor or infectious growth around the heart valves. Tell a health care provider about:  Any allergies you have.  All medicines you are taking, including vitamins, herbs, eye drops, creams, and over-the-counter medicines.  Any blood disorders you have.  Any surgeries you have had.  Any medical conditions you have.  Whether you are pregnant or may be pregnant. What are the risks? Generally, this is a safe procedure. However, problems may occur, including:  Allergic reaction to dye (contrast) that may be used during the procedure. What happens before the procedure? No specific preparation is needed. You may eat and drink normally. What happens during the procedure?   An IV tube may be inserted into one of your veins.  You may receive contrast through this tube. A contrast is an injection that improves the quality of the pictures from your heart.  A gel will be applied to your chest.  A wand-like tool (transducer) will be moved over your chest. The gel will help to transmit the sound waves from the transducer.  The sound waves will harmlessly bounce off of your heart to allow the heart images to be captured in real-time motion. The images will be recorded on a computer.  The procedure may vary among health care providers and hospitals. What happens after the procedure?  You may return to your normal, everyday life, including diet, activities, and medicines, unless your health care provider tells you not to do that. Summary  An echocardiogram is a procedure that uses painless sound waves (ultrasound) to produce an image  of the heart.  Images from an echocardiogram can provide important information about the size and shape of your heart, heart muscle function, heart valve function, and fluid buildup around your heart.  You do not need to do anything to prepare before this procedure. You may eat and drink normally.  After the echocardiogram is completed, you may return to your normal, everyday life, unless your health care provider tells you not to do that. This information is not intended to replace advice given to you by your health care provider. Make sure you discuss any questions you have with your health care provider. Document Revised: 01/12/2019 Document Reviewed: 10/24/2016 Elsevier Patient Education  [place].

## 2019-11-14 NOTE — Progress Notes (Signed)
Cardiology Office Note:    Date:  11/14/2019   ID:  Taylor Spencer, DOB 1935/07/03, MRN [id number]  PCP:  Nicoletta Dress, MD  Cardiologist:  Jenean Lindau, MD   Referring MD: Nicoletta Dress, MD    ASSESSMENT:    1. Cardiac murmur   2. Permanent atrial fibrillation ([place])   3. History of pulmonary embolism    PLAN:    In order of problems listed above:  1. Coronary artery disease: Secondary prevention stressed with the patient.  Importance of compliance with diet and medication stressed and he vocalized understanding.  He walks on a regular basis and has good effort tolerance for somebody his age. 2. Permanent atrial fibrillation:I discussed with the patient atrial fibrillation, disease process. Management and therapy including rate and rhythm control, anticoagulation benefits and potential risks were discussed extensively with the patient. Patient had multiple questions which were answered to patient's satisfaction.  Echocardiogram will be done to assess murmur heard on auscultation.  History of pulmonary embolism: On anticoagulation as mentioned above. 3. Mixed dyslipidemia: Diet was discussed.  Importance of regular exercise stressed.  Weight reduction stressed and he promises to do better.  Lipids followed by primary care physician. 4. Patient will be seen in follow-up appointment in 6 months or earlier if the patient has any concerns   Medication Adjustments/Labs and Tests Ordered: Current medicines are reviewed at length with the patient today.  Concerns regarding medicines are outlined above.  Orders Placed This Encounter  Procedures  . EKG 12-Lead  . ECHOCARDIOGRAM COMPLETE   No orders of the defined types were placed in this encounter.    Chief Complaint  Patient presents with  . Annual Exam     History of Present Illness:    Taylor Spencer is a 85 y.o. male.  Patient has history of coronary artery disease, atrial fibrillation and  history of pulmonary embolism.  He denies any problems at this time and takes care of activities of daily living.  No chest pain orthopnea or PND.  He walks on a regular basis.  At the time of my evaluation, the patient is alert awake oriented and in no distress.  Past Medical History:  Diagnosis Date  . Atrial fibrillation ([place])    not on anticoagulation  . Barrett's esophagus   . Chronic thoracic aortic dissection ([facility])    type B noted on CTA chest  . Coronary artery disease    stent in 2010  . Emphysema of lung ([place])    seen on CT imaging  . History of colon polyps   . Hypothyroid   . Lung nodule < 6cm on CT 08/27/2016   31mm right upper lobe without change compared with previous CT 12/06/12 per radiology  . Pulmonary embolism ([place]) 2012   Treated w/ 1-2 weeks of anticoagulation & stopped due to epistaxis. No clear cause.    Past Surgical History:  Procedure Laterality Date  . CATARACT EXTRACTION, BILATERAL  04/2016  . COLONOSCOPY WITH ESOPHAGOGASTRODUODENOSCOPY (EGD)     last colonoscopy in 2016?  . INGUINAL HERNIA REPAIR Bilateral   . INNER EAR SURGERY Left   . LEFT HEART CATH  2010   Stent Placed x1  . rotator cuff Right 06/30/2016    Current Medications: Current Meds  Medication Sig  . albuterol (PROVENTIL) (2.5 MG/3ML) 0.083% nebulizer solution Take 2.5 mg by nebulization every 6 (six) hours as needed for wheezing or shortness of breath.  Marland Kitchen apixaban (ELIQUIS) 5  MG TABS tablet Take 5 mg by mouth 2 (two) times daily.  Marland Kitchen b complex vitamins tablet Take by mouth.  . furosemide (LASIX) 80 MG tablet Take 80 mg by mouth as needed.  Marland Kitchen levothyroxine (SYNTHROID, LEVOTHROID) 88 MCG tablet Take 88 mcg by mouth daily.  . metoprolol tartrate (LOPRESSOR) 25 MG tablet Take 25 mg by mouth daily.  . Omega-3 Fatty Acids (FISH OIL) 1000 MG CAPS Take 1 capsule by mouth.  Marland Kitchen omeprazole (PRILOSEC) 20 MG capsule Take 20 mg by mouth daily.  . vitamin B-12 (CYANOCOBALAMIN) 250 MCG tablet Take  250 mcg by mouth daily.  . [DISCONTINUED] Cyanocobalamin (VITAMIN B 12) 250 MCG LOZG Take 1 tablet by mouth daily.     Allergies:   Patient has no known allergies.   Social History   Socioeconomic History  . Marital status: Widowed    Spouse name: Not on file  . Number of children: Not on file  . Years of education: Not on file  . Highest education level: Not on file  Occupational History  . Not on file  Tobacco Use  . Smoking status: Former Smoker    Packs/day: 1.00    Years: 25.00    Pack years: 25.00    Types: Cigarettes    Quit date: 10/06/1975    Years since quitting: 44.1  . Smokeless tobacco: Former Systems developer    Types: [place] date: 10/06/1975  Substance and Sexual Activity  . Alcohol use: Yes    Comment: social   . Drug use: No  . Sexual activity: Not on file  Other Topics Concern  . Not on file  Social History Narrative   [facility]:    Patient is originally from [facility]. Previously worked in the [employer] for approximately 26 years. 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 Prior to his shoulder surgery and cataract surgery was enjoying golfing and retirement with his children locally. Patient has made multiple car trips within a fairly recent time. Approximately 2 weeks ago he did drive to [facility] to a funeral for approximately 4 hours trip duration but did stop during this travel. Approximately 6 weeks ago he did take a driving trip to [facility] which lasted approximately 3.5-4 hours during which time he did stop. In August he took a trip to [facility] for approximately 3 hours again taking a stop. He is made to separate trips within that time to [place] approximately 5 hours in duration each reporting that he did take stops during this time as well.   Social Determinants of Health   Financial Resource Strain:   . Difficulty of  Paying Living Expenses: Not on file  Food Insecurity:   . Worried About Charity fundraiser in the Last Year: Not on file  . Ran Out of Food in the Last Year: Not on file  Transportation Needs:   . Lack of Transportation (Medical): Not on file  . Lack of Transportation (Non-Medical): Not on file  Physical Activity:   . Days of Exercise per Week: Not on file  . Minutes of Exercise per Session: Not on file  Stress:   . Feeling of Stress : Not on file  Social Connections:   . Frequency of Communication with Friends and Family: Not on file  . Frequency of Social Gatherings with Friends and Family: Not on file  .  Attends Religious Services: Not on file  . Active Member of Clubs or Organizations: Not on file  . Attends Archivist Meetings: Not on file  . Marital Status: Not on file     Family History: The patient's family history includes Breast cancer in his daughter, daughter, grandchild, and mother; Colon cancer in his mother; Diabetes in his mother; Heart attack (age of onset: 2) in his father; Heart attack (age of onset: 50) in his mother; Liver cancer in his brother. There is no history of Rheumatologic disease or Clotting disorder.  ROS:   Please see the history of present illness.    All other systems reviewed and are negative.  EKGs/Labs/Other Studies Reviewed:    The following studies were reviewed today: EKG reveals atrial fibrillation with well-controlled ventricular rate.   Recent Labs: No results found for requested labs within last 8760 hours.  Recent Lipid Panel No results found for: CHOL, TRIG, HDL, CHOLHDL, VLDL, LDLCALC, LDLDIRECT  Physical Exam:    VS:  BP 120/80 (BP Location: Left Arm, Patient Position: Sitting, Cuff Size: Normal)   Pulse 81   Ht 6' (1.829 m)   Wt 208 lb (94.3 kg)   SpO2 95%   BMI 28.21 kg/m     Wt Readings from Last 3 Encounters:  11/14/19 208 lb (94.3 kg)  05/13/18 200 lb (90.7 kg)  03/05/17 207 lb 6.4 oz (94.1 kg)      GEN: Patient is in no acute distress HEENT: Normal NECK: No JVD; No carotid bruits LYMPHATICS: No lymphadenopathy CARDIAC: Hear sounds regular, 2/6 systolic murmur at the apex. RESPIRATORY:  Clear to auscultation without rales, wheezing or rhonchi  ABDOMEN: Soft, non-tender, non-distended MUSCULOSKELETAL:  No edema; No deformity  SKIN: Warm and dry NEUROLOGIC:  Alert and oriented x 3 PSYCHIATRIC:  Normal affect   Signed, Jenean Lindau, MD  11/14/2019 9:44 AM    [place]

## 2019-12-18 ENCOUNTER — Other Ambulatory Visit: Payer: Self-pay

## 2019-12-18 ENCOUNTER — Ambulatory Visit (INDEPENDENT_AMBULATORY_CARE_PROVIDER_SITE_OTHER): Payer: Medicare Other

## 2019-12-18 DIAGNOSIS — R011 Cardiac murmur, unspecified: Secondary | ICD-10-CM | POA: Diagnosis not present

## 2019-12-18 NOTE — Progress Notes (Unsigned)
Complete echocardiogram has been performed.  Jimmy Ranjit Ashurst RDCS, RVT 

## 2020-02-07 DIAGNOSIS — I4891 Unspecified atrial fibrillation: Secondary | ICD-10-CM | POA: Diagnosis not present

## 2020-02-07 DIAGNOSIS — E785 Hyperlipidemia, unspecified: Secondary | ICD-10-CM | POA: Diagnosis not present

## 2020-02-07 DIAGNOSIS — Z79899 Other long term (current) drug therapy: Secondary | ICD-10-CM | POA: Diagnosis not present

## 2020-02-07 DIAGNOSIS — J449 Chronic obstructive pulmonary disease, unspecified: Secondary | ICD-10-CM | POA: Diagnosis not present

## 2020-02-07 DIAGNOSIS — Z86711 Personal history of pulmonary embolism: Secondary | ICD-10-CM | POA: Diagnosis not present

## 2020-02-07 DIAGNOSIS — E039 Hypothyroidism, unspecified: Secondary | ICD-10-CM | POA: Diagnosis not present

## 2020-03-06 DIAGNOSIS — Z961 Presence of intraocular lens: Secondary | ICD-10-CM | POA: Diagnosis not present

## 2020-03-06 DIAGNOSIS — H3561 Retinal hemorrhage, right eye: Secondary | ICD-10-CM | POA: Diagnosis not present

## 2020-03-06 DIAGNOSIS — H353121 Nonexudative age-related macular degeneration, left eye, early dry stage: Secondary | ICD-10-CM | POA: Diagnosis not present

## 2020-05-10 DIAGNOSIS — I4891 Unspecified atrial fibrillation: Secondary | ICD-10-CM | POA: Diagnosis not present

## 2020-05-10 DIAGNOSIS — Z79899 Other long term (current) drug therapy: Secondary | ICD-10-CM | POA: Diagnosis not present

## 2020-05-10 DIAGNOSIS — E785 Hyperlipidemia, unspecified: Secondary | ICD-10-CM | POA: Diagnosis not present

## 2020-05-10 DIAGNOSIS — Z86711 Personal history of pulmonary embolism: Secondary | ICD-10-CM | POA: Diagnosis not present

## 2020-05-10 DIAGNOSIS — J449 Chronic obstructive pulmonary disease, unspecified: Secondary | ICD-10-CM | POA: Diagnosis not present

## 2020-05-10 DIAGNOSIS — E039 Hypothyroidism, unspecified: Secondary | ICD-10-CM | POA: Diagnosis not present

## 2020-05-28 ENCOUNTER — Other Ambulatory Visit: Payer: Self-pay | Admitting: Cardiology

## 2021-02-04 DIAGNOSIS — L918 Other hypertrophic disorders of the skin: Secondary | ICD-10-CM | POA: Diagnosis not present

## 2021-02-18 DIAGNOSIS — L570 Actinic keratosis: Secondary | ICD-10-CM | POA: Diagnosis not present

## 2021-02-18 DIAGNOSIS — L578 Other skin changes due to chronic exposure to nonionizing radiation: Secondary | ICD-10-CM | POA: Diagnosis not present

## 2021-02-18 DIAGNOSIS — C44529 Squamous cell carcinoma of skin of other part of trunk: Secondary | ICD-10-CM | POA: Diagnosis not present

## 2021-02-18 DIAGNOSIS — L821 Other seborrheic keratosis: Secondary | ICD-10-CM | POA: Diagnosis not present

## 2021-04-11 DIAGNOSIS — J449 Chronic obstructive pulmonary disease, unspecified: Secondary | ICD-10-CM | POA: Diagnosis not present

## 2021-04-11 DIAGNOSIS — E785 Hyperlipidemia, unspecified: Secondary | ICD-10-CM | POA: Diagnosis not present

## 2021-04-11 DIAGNOSIS — Z86711 Personal history of pulmonary embolism: Secondary | ICD-10-CM | POA: Diagnosis not present

## 2021-04-11 DIAGNOSIS — Z79899 Other long term (current) drug therapy: Secondary | ICD-10-CM | POA: Diagnosis not present

## 2021-04-11 DIAGNOSIS — I4891 Unspecified atrial fibrillation: Secondary | ICD-10-CM | POA: Diagnosis not present

## 2021-04-11 DIAGNOSIS — E039 Hypothyroidism, unspecified: Secondary | ICD-10-CM | POA: Diagnosis not present

## 2021-04-22 DIAGNOSIS — Z9181 History of falling: Secondary | ICD-10-CM | POA: Diagnosis not present

## 2021-04-22 DIAGNOSIS — E785 Hyperlipidemia, unspecified: Secondary | ICD-10-CM | POA: Diagnosis not present

## 2021-04-22 DIAGNOSIS — Z139 Encounter for screening, unspecified: Secondary | ICD-10-CM | POA: Diagnosis not present

## 2021-04-22 DIAGNOSIS — Z0000 Encounter for general adult medical examination without abnormal findings: Secondary | ICD-10-CM | POA: Diagnosis not present

## 2021-05-14 DIAGNOSIS — H353121 Nonexudative age-related macular degeneration, left eye, early dry stage: Secondary | ICD-10-CM | POA: Diagnosis not present

## 2021-05-14 DIAGNOSIS — Z961 Presence of intraocular lens: Secondary | ICD-10-CM | POA: Diagnosis not present

## 2021-05-22 DIAGNOSIS — D485 Neoplasm of uncertain behavior of skin: Secondary | ICD-10-CM | POA: Diagnosis not present

## 2021-05-22 DIAGNOSIS — L578 Other skin changes due to chronic exposure to nonionizing radiation: Secondary | ICD-10-CM | POA: Diagnosis not present

## 2021-05-22 DIAGNOSIS — L570 Actinic keratosis: Secondary | ICD-10-CM | POA: Diagnosis not present

## 2021-05-22 DIAGNOSIS — L821 Other seborrheic keratosis: Secondary | ICD-10-CM | POA: Diagnosis not present

## 2021-07-08 DIAGNOSIS — G5793 Unspecified mononeuropathy of bilateral lower limbs: Secondary | ICD-10-CM | POA: Diagnosis not present

## 2021-08-08 DIAGNOSIS — J449 Chronic obstructive pulmonary disease, unspecified: Secondary | ICD-10-CM | POA: Diagnosis not present

## 2021-08-08 DIAGNOSIS — Z79899 Other long term (current) drug therapy: Secondary | ICD-10-CM | POA: Diagnosis not present

## 2021-08-08 DIAGNOSIS — Z23 Encounter for immunization: Secondary | ICD-10-CM | POA: Diagnosis not present

## 2021-08-08 DIAGNOSIS — E785 Hyperlipidemia, unspecified: Secondary | ICD-10-CM | POA: Diagnosis not present

## 2021-08-08 DIAGNOSIS — I4891 Unspecified atrial fibrillation: Secondary | ICD-10-CM | POA: Diagnosis not present

## 2021-08-08 DIAGNOSIS — G5793 Unspecified mononeuropathy of bilateral lower limbs: Secondary | ICD-10-CM | POA: Diagnosis not present

## 2021-08-08 DIAGNOSIS — E039 Hypothyroidism, unspecified: Secondary | ICD-10-CM | POA: Diagnosis not present

## 2021-08-08 DIAGNOSIS — Z86711 Personal history of pulmonary embolism: Secondary | ICD-10-CM | POA: Diagnosis not present

## 2021-11-06 DIAGNOSIS — H02132 Senile ectropion of right lower eyelid: Secondary | ICD-10-CM | POA: Diagnosis not present

## 2021-11-06 DIAGNOSIS — H353131 Nonexudative age-related macular degeneration, bilateral, early dry stage: Secondary | ICD-10-CM | POA: Diagnosis not present

## 2021-11-06 DIAGNOSIS — H35033 Hypertensive retinopathy, bilateral: Secondary | ICD-10-CM | POA: Diagnosis not present

## 2021-11-10 DIAGNOSIS — Z86711 Personal history of pulmonary embolism: Secondary | ICD-10-CM | POA: Diagnosis not present

## 2021-11-10 DIAGNOSIS — E785 Hyperlipidemia, unspecified: Secondary | ICD-10-CM | POA: Diagnosis not present

## 2021-11-10 DIAGNOSIS — J449 Chronic obstructive pulmonary disease, unspecified: Secondary | ICD-10-CM | POA: Diagnosis not present

## 2021-11-10 DIAGNOSIS — G5793 Unspecified mononeuropathy of bilateral lower limbs: Secondary | ICD-10-CM | POA: Diagnosis not present

## 2021-11-10 DIAGNOSIS — I4891 Unspecified atrial fibrillation: Secondary | ICD-10-CM | POA: Diagnosis not present

## 2021-11-10 DIAGNOSIS — E039 Hypothyroidism, unspecified: Secondary | ICD-10-CM | POA: Diagnosis not present

## 2021-11-10 DIAGNOSIS — H9201 Otalgia, right ear: Secondary | ICD-10-CM | POA: Diagnosis not present

## 2022-02-09 DIAGNOSIS — G5793 Unspecified mononeuropathy of bilateral lower limbs: Secondary | ICD-10-CM | POA: Diagnosis not present

## 2022-02-09 DIAGNOSIS — Z86711 Personal history of pulmonary embolism: Secondary | ICD-10-CM | POA: Diagnosis not present

## 2022-02-09 DIAGNOSIS — E039 Hypothyroidism, unspecified: Secondary | ICD-10-CM | POA: Diagnosis not present

## 2022-02-09 DIAGNOSIS — R600 Localized edema: Secondary | ICD-10-CM | POA: Diagnosis not present

## 2022-02-09 DIAGNOSIS — I4891 Unspecified atrial fibrillation: Secondary | ICD-10-CM | POA: Diagnosis not present

## 2022-02-09 DIAGNOSIS — E785 Hyperlipidemia, unspecified: Secondary | ICD-10-CM | POA: Diagnosis not present

## 2022-02-09 DIAGNOSIS — J449 Chronic obstructive pulmonary disease, unspecified: Secondary | ICD-10-CM | POA: Diagnosis not present

## 2022-05-16 DIAGNOSIS — Z86711 Personal history of pulmonary embolism: Secondary | ICD-10-CM | POA: Diagnosis not present

## 2022-05-16 DIAGNOSIS — J449 Chronic obstructive pulmonary disease, unspecified: Secondary | ICD-10-CM | POA: Diagnosis not present

## 2022-05-16 DIAGNOSIS — E785 Hyperlipidemia, unspecified: Secondary | ICD-10-CM | POA: Diagnosis not present

## 2022-05-16 DIAGNOSIS — Z79899 Other long term (current) drug therapy: Secondary | ICD-10-CM | POA: Diagnosis not present

## 2022-05-16 DIAGNOSIS — G5793 Unspecified mononeuropathy of bilateral lower limbs: Secondary | ICD-10-CM | POA: Diagnosis not present

## 2022-05-16 DIAGNOSIS — E039 Hypothyroidism, unspecified: Secondary | ICD-10-CM | POA: Diagnosis not present

## 2022-05-16 DIAGNOSIS — I4891 Unspecified atrial fibrillation: Secondary | ICD-10-CM | POA: Diagnosis not present

## 2022-05-18 DIAGNOSIS — H02132 Senile ectropion of right lower eyelid: Secondary | ICD-10-CM | POA: Diagnosis not present

## 2022-05-18 DIAGNOSIS — H353131 Nonexudative age-related macular degeneration, bilateral, early dry stage: Secondary | ICD-10-CM | POA: Diagnosis not present

## 2022-07-08 DIAGNOSIS — Z0000 Encounter for general adult medical examination without abnormal findings: Secondary | ICD-10-CM | POA: Diagnosis not present

## 2022-07-08 DIAGNOSIS — Z23 Encounter for immunization: Secondary | ICD-10-CM | POA: Diagnosis not present

## 2022-07-08 DIAGNOSIS — E785 Hyperlipidemia, unspecified: Secondary | ICD-10-CM | POA: Diagnosis not present

## 2022-09-17 DIAGNOSIS — G5793 Unspecified mononeuropathy of bilateral lower limbs: Secondary | ICD-10-CM | POA: Diagnosis not present

## 2022-09-17 DIAGNOSIS — J449 Chronic obstructive pulmonary disease, unspecified: Secondary | ICD-10-CM | POA: Diagnosis not present

## 2022-09-17 DIAGNOSIS — I4891 Unspecified atrial fibrillation: Secondary | ICD-10-CM | POA: Diagnosis not present

## 2022-09-17 DIAGNOSIS — K219 Gastro-esophageal reflux disease without esophagitis: Secondary | ICD-10-CM | POA: Diagnosis not present

## 2022-09-17 DIAGNOSIS — Z79899 Other long term (current) drug therapy: Secondary | ICD-10-CM | POA: Diagnosis not present

## 2022-09-17 DIAGNOSIS — E039 Hypothyroidism, unspecified: Secondary | ICD-10-CM | POA: Diagnosis not present

## 2022-09-17 DIAGNOSIS — E785 Hyperlipidemia, unspecified: Secondary | ICD-10-CM | POA: Diagnosis not present

## 2022-09-17 DIAGNOSIS — Z86711 Personal history of pulmonary embolism: Secondary | ICD-10-CM | POA: Diagnosis not present

## 2022-12-17 DIAGNOSIS — E785 Hyperlipidemia, unspecified: Secondary | ICD-10-CM | POA: Diagnosis not present

## 2022-12-17 DIAGNOSIS — J449 Chronic obstructive pulmonary disease, unspecified: Secondary | ICD-10-CM | POA: Diagnosis not present

## 2022-12-17 DIAGNOSIS — E039 Hypothyroidism, unspecified: Secondary | ICD-10-CM | POA: Diagnosis not present

## 2022-12-17 DIAGNOSIS — I4891 Unspecified atrial fibrillation: Secondary | ICD-10-CM | POA: Diagnosis not present

## 2022-12-17 DIAGNOSIS — I2510 Atherosclerotic heart disease of native coronary artery without angina pectoris: Secondary | ICD-10-CM | POA: Diagnosis not present

## 2022-12-17 DIAGNOSIS — D751 Secondary polycythemia: Secondary | ICD-10-CM | POA: Diagnosis not present

## 2023-01-12 ENCOUNTER — Telehealth: Payer: Self-pay

## 2023-01-12 NOTE — Patient Outreach (Signed)
  Care Coordination   01/12/2023 Name: Taylor Spencer MRN: [id number] DOB: 11/09/1934   Care Coordination Outreach Attempts:  An unsuccessful telephone outreach was attempted today to offer the patient information about available care coordination services as a benefit of their health plan.   Follow Up Plan:  Additional outreach attempts will be made to offer the patient care coordination information and services.   Encounter Outcome:  No Answer   Care Coordination Interventions:  No, not indicated    Rowe Pavy, RN, BSN, [facility] [employer] [phone]

## 2023-01-21 ENCOUNTER — Telehealth: Payer: Self-pay

## 2023-01-21 NOTE — Patient Outreach (Signed)
  Care Coordination   01/21/2023 Name: Taylor Spencer MRN: [id number] DOB: [birth year]   Care Coordination Outreach Attempts:  A second unsuccessful outreach was attempted today to offer the patient with information about available care coordination services as a benefit of their health plan.     Follow Up Plan:  Additional outreach attempts will be made to offer the patient care coordination information and services.   Encounter Outcome:  No Answer   Care Coordination Interventions:  No, not indicated    Rowe Pavy, RN, BSN, [facility] [employer] [phone]

## 2023-01-22 ENCOUNTER — Telehealth: Payer: Self-pay

## 2023-01-22 NOTE — Patient Outreach (Signed)
  Care Coordination   01/22/2023 Name: Taylor Spencer MRN: [id number] DOB: [birth year]   Care Coordination Outreach Attempts:  A third unsuccessful outreach was attempted today to offer the patient with information about available care coordination services as a benefit of their health plan.   Follow Up Plan:  No further outreach attempts will be made at this time. We have been unable to contact the patient to offer or enroll patient in care coordination services  Encounter Outcome:  No Answer   Care Coordination Interventions:  No, not indicated    Rowe Pavy, RN, BSN, CEN [facility] Coordinator [phone]

## 2023-04-05 DIAGNOSIS — E039 Hypothyroidism, unspecified: Secondary | ICD-10-CM | POA: Diagnosis not present

## 2023-04-05 DIAGNOSIS — I4891 Unspecified atrial fibrillation: Secondary | ICD-10-CM | POA: Diagnosis not present

## 2023-04-05 DIAGNOSIS — E785 Hyperlipidemia, unspecified: Secondary | ICD-10-CM | POA: Diagnosis not present

## 2023-04-05 DIAGNOSIS — G5793 Unspecified mononeuropathy of bilateral lower limbs: Secondary | ICD-10-CM | POA: Diagnosis not present

## 2023-04-05 DIAGNOSIS — D751 Secondary polycythemia: Secondary | ICD-10-CM | POA: Diagnosis not present

## 2023-04-05 DIAGNOSIS — K219 Gastro-esophageal reflux disease without esophagitis: Secondary | ICD-10-CM | POA: Diagnosis not present

## 2023-05-10 DIAGNOSIS — G5793 Unspecified mononeuropathy of bilateral lower limbs: Secondary | ICD-10-CM | POA: Diagnosis not present

## 2023-05-25 ENCOUNTER — Ambulatory Visit: Payer: 59 | Admitting: Podiatry

## 2023-05-25 DIAGNOSIS — M792 Neuralgia and neuritis, unspecified: Secondary | ICD-10-CM | POA: Diagnosis not present

## 2023-05-25 NOTE — Progress Notes (Signed)
Subjective:  Patient ID: Taylor Spencer, male    DOB: Aug 15, 1935,  MRN: [id number]  Chief Complaint  Patient presents with   Foot Problem    Pt is here because he has been dealing with neuropathy for the past 4 years and was told by his kids to come here     88 y.o. male presents with concern for neuropathy and neuropathic pain in the bilateral foot.  He says he feels like he is walking on a piece of leather on both feet.  He has decreased sensation in the toes and the ball of the foot.  He has been dealing with neuropathic pain for about 4 years.  He is not entirely sure why he has neuropathy but does not have any history of diabetes.  Describes burning and tingling sensation in both feet and the feet commonly burn at night.  He has tried gabapentin and Lyrica but he is not able to tolerate them.  He is also been trying amitriptyline but is not able to tolerate that either.  Past Medical History:  Diagnosis Date   Atrial fibrillation (HCC)    not on anticoagulation   Barrett's esophagus    Chronic thoracic aortic dissection ([facility])    type B noted on CTA chest   Coronary artery disease    stent in 2010   Emphysema of lung ([facility])    seen on CT imaging   History of colon polyps    Hypothyroid    Lung nodule < 6cm on CT 08/27/2016   7mm right upper lobe without change compared with previous CT 12/06/12 per radiology   Pulmonary embolism ([facility]) 2012   Treated w/ 1-2 weeks of anticoagulation & stopped due to epistaxis. No clear cause.    No Known Allergies  ROS: Negative except as per HPI above  Objective:  General: AAO x3, NAD  Dermatological: With inspection and palpation of the right and left lower extremities there are no open sores, no preulcerative lesions, no rash or signs of infection present. Nails are of normal length thickness and coloration.   Vascular:  Dorsalis Pedis artery and Posterior Tibial artery pedal pulses are 2/4 bilateral.  Capillary fill time < 3 sec  to all digits.   Neruologic: Grossly diminished via light touch to the forefoot plantarly.  Subjective sensation of burning and tingling sensation in both feet especially on the ball of the foot area  Musculoskeletal: No gross boney pedal deformities bilateral. No pain, crepitus, or limitation noted with foot and ankle range of motion bilateral. Muscular strength 5/5 in all groups tested bilateral.  Gait: Unassisted, Nonantalgic.   No images are attached to the encounter.   Assessment:   1. Neuropathic pain     Plan:  Patient was evaluated and treated and all questions answered.  # Neuropathic pain of bilateral foot -Neuropathy of unknown known etiology of bilateral forefoot -Discussed treatment options in detail with the patient -He has tried many of the treatment options I would have already discussed however he has not tried any Qutenza topical medicated therapy -I recommend trying Qutenza and offered him information with regards to this. -Stated that while we are not providing Qutenza in this office at this time I Referred him to the neurologist for consideration.          Corinna Gab, DPM [facility]

## 2023-06-16 DIAGNOSIS — J208 Acute bronchitis due to other specified organisms: Secondary | ICD-10-CM | POA: Diagnosis not present

## 2023-06-16 DIAGNOSIS — B9689 Other specified bacterial agents as the cause of diseases classified elsewhere: Secondary | ICD-10-CM | POA: Diagnosis not present

## 2023-06-16 DIAGNOSIS — U071 COVID-19: Secondary | ICD-10-CM | POA: Diagnosis not present

## 2023-07-16 DIAGNOSIS — J449 Chronic obstructive pulmonary disease, unspecified: Secondary | ICD-10-CM | POA: Diagnosis not present

## 2023-07-16 DIAGNOSIS — E039 Hypothyroidism, unspecified: Secondary | ICD-10-CM | POA: Diagnosis not present

## 2023-07-16 DIAGNOSIS — K219 Gastro-esophageal reflux disease without esophagitis: Secondary | ICD-10-CM | POA: Diagnosis not present

## 2023-07-16 DIAGNOSIS — E785 Hyperlipidemia, unspecified: Secondary | ICD-10-CM | POA: Diagnosis not present

## 2023-07-16 DIAGNOSIS — Z9861 Coronary angioplasty status: Secondary | ICD-10-CM | POA: Diagnosis not present

## 2023-07-16 DIAGNOSIS — Z23 Encounter for immunization: Secondary | ICD-10-CM | POA: Diagnosis not present

## 2023-07-16 DIAGNOSIS — Z86711 Personal history of pulmonary embolism: Secondary | ICD-10-CM | POA: Diagnosis not present

## 2023-07-16 DIAGNOSIS — G5793 Unspecified mononeuropathy of bilateral lower limbs: Secondary | ICD-10-CM | POA: Diagnosis not present

## 2023-07-16 DIAGNOSIS — I4891 Unspecified atrial fibrillation: Secondary | ICD-10-CM | POA: Diagnosis not present

## 2023-07-16 DIAGNOSIS — I2510 Atherosclerotic heart disease of native coronary artery without angina pectoris: Secondary | ICD-10-CM | POA: Diagnosis not present

## 2023-08-25 ENCOUNTER — Ambulatory Visit: Payer: 59 | Admitting: Neurology

## 2023-09-21 DIAGNOSIS — G5793 Unspecified mononeuropathy of bilateral lower limbs: Secondary | ICD-10-CM | POA: Diagnosis not present

## 2023-10-13 DIAGNOSIS — K219 Gastro-esophageal reflux disease without esophagitis: Secondary | ICD-10-CM | POA: Diagnosis not present

## 2023-10-13 DIAGNOSIS — K2270 Barrett's esophagus without dysplasia: Secondary | ICD-10-CM | POA: Diagnosis not present

## 2023-10-13 DIAGNOSIS — R194 Change in bowel habit: Secondary | ICD-10-CM | POA: Diagnosis not present

## 2023-10-13 DIAGNOSIS — K5790 Diverticulosis of intestine, part unspecified, without perforation or abscess without bleeding: Secondary | ICD-10-CM | POA: Diagnosis not present

## 2023-10-19 DIAGNOSIS — E039 Hypothyroidism, unspecified: Secondary | ICD-10-CM | POA: Diagnosis not present

## 2023-10-19 DIAGNOSIS — J449 Chronic obstructive pulmonary disease, unspecified: Secondary | ICD-10-CM | POA: Diagnosis not present

## 2023-10-19 DIAGNOSIS — I4891 Unspecified atrial fibrillation: Secondary | ICD-10-CM | POA: Diagnosis not present

## 2023-10-19 DIAGNOSIS — I2510 Atherosclerotic heart disease of native coronary artery without angina pectoris: Secondary | ICD-10-CM | POA: Diagnosis not present

## 2023-10-19 DIAGNOSIS — G5793 Unspecified mononeuropathy of bilateral lower limbs: Secondary | ICD-10-CM | POA: Diagnosis not present

## 2023-10-19 DIAGNOSIS — E785 Hyperlipidemia, unspecified: Secondary | ICD-10-CM | POA: Diagnosis not present

## 2023-10-19 DIAGNOSIS — Z9861 Coronary angioplasty status: Secondary | ICD-10-CM | POA: Diagnosis not present

## 2023-10-19 DIAGNOSIS — K219 Gastro-esophageal reflux disease without esophagitis: Secondary | ICD-10-CM | POA: Diagnosis not present

## 2023-10-19 DIAGNOSIS — Z86711 Personal history of pulmonary embolism: Secondary | ICD-10-CM | POA: Diagnosis not present

## 2023-10-19 DIAGNOSIS — D751 Secondary polycythemia: Secondary | ICD-10-CM | POA: Diagnosis not present

## 2023-11-11 DIAGNOSIS — J069 Acute upper respiratory infection, unspecified: Secondary | ICD-10-CM | POA: Diagnosis not present

## 2023-12-02 DIAGNOSIS — K5790 Diverticulosis of intestine, part unspecified, without perforation or abscess without bleeding: Secondary | ICD-10-CM | POA: Diagnosis not present

## 2023-12-02 DIAGNOSIS — R194 Change in bowel habit: Secondary | ICD-10-CM | POA: Diagnosis not present

## 2023-12-02 DIAGNOSIS — K219 Gastro-esophageal reflux disease without esophagitis: Secondary | ICD-10-CM | POA: Diagnosis not present

## 2023-12-02 DIAGNOSIS — K2270 Barrett's esophagus without dysplasia: Secondary | ICD-10-CM | POA: Diagnosis not present

## 2024-01-07 DIAGNOSIS — M19072 Primary osteoarthritis, left ankle and foot: Secondary | ICD-10-CM | POA: Diagnosis not present

## 2024-01-07 DIAGNOSIS — M19071 Primary osteoarthritis, right ankle and foot: Secondary | ICD-10-CM | POA: Diagnosis not present

## 2024-01-07 DIAGNOSIS — G5793 Unspecified mononeuropathy of bilateral lower limbs: Secondary | ICD-10-CM | POA: Diagnosis not present

## 2024-02-02 DIAGNOSIS — G5793 Unspecified mononeuropathy of bilateral lower limbs: Secondary | ICD-10-CM | POA: Diagnosis not present

## 2024-02-02 DIAGNOSIS — J449 Chronic obstructive pulmonary disease, unspecified: Secondary | ICD-10-CM | POA: Diagnosis not present

## 2024-02-02 DIAGNOSIS — K219 Gastro-esophageal reflux disease without esophagitis: Secondary | ICD-10-CM | POA: Diagnosis not present

## 2024-02-02 DIAGNOSIS — I4891 Unspecified atrial fibrillation: Secondary | ICD-10-CM | POA: Diagnosis not present

## 2024-02-02 DIAGNOSIS — E039 Hypothyroidism, unspecified: Secondary | ICD-10-CM | POA: Diagnosis not present

## 2024-02-02 DIAGNOSIS — E785 Hyperlipidemia, unspecified: Secondary | ICD-10-CM | POA: Diagnosis not present

## 2024-02-02 DIAGNOSIS — I2510 Atherosclerotic heart disease of native coronary artery without angina pectoris: Secondary | ICD-10-CM | POA: Diagnosis not present

## 2024-02-02 DIAGNOSIS — D751 Secondary polycythemia: Secondary | ICD-10-CM | POA: Diagnosis not present

## 2024-02-02 DIAGNOSIS — Z86711 Personal history of pulmonary embolism: Secondary | ICD-10-CM | POA: Diagnosis not present

## 2024-02-02 DIAGNOSIS — Z9861 Coronary angioplasty status: Secondary | ICD-10-CM | POA: Diagnosis not present

## 2024-04-26 ENCOUNTER — Telehealth: Payer: Self-pay | Admitting: Diagnostic Neuroimaging

## 2024-04-26 NOTE — Telephone Encounter (Signed)
 Patient called to confirm appointment.

## 2024-05-03 ENCOUNTER — Encounter: Payer: Self-pay | Admitting: Diagnostic Neuroimaging

## 2024-05-03 ENCOUNTER — Ambulatory Visit: Admitting: Diagnostic Neuroimaging

## 2024-05-03 VITALS — BP 120/83 | HR 68 | Ht 72.0 in | Wt 215.0 lb

## 2024-05-03 DIAGNOSIS — G629 Polyneuropathy, unspecified: Secondary | ICD-10-CM

## 2024-05-03 NOTE — Progress Notes (Signed)
 [facility]  PATIENT: Taylor Spencer DOB: 08-Jul-1935  REFERRING CLINICIAN: Keren Vicenta BRAVO, MD HISTORY FROM: patient and daughter REASON FOR VISIT: new consult   HISTORICAL  CHIEF COMPLAINT:  Chief Complaint  Patient presents with   Numbness    RM 6 with daughter  Pt is well, reports he is having burning, tingling, numbness and pins and needles in both feet for about 7 yrs, symptoms have progressed over time.     HISTORY OF PRESENT ILLNESS:   89 year old male here for evaluation of painful feet.  Symptoms started around 2018 with onset of pain, aching, burning, pins and needle sensation in the toes and feet.  Symptoms of gradual worsened over time.  Has tried variety of medications, gabapentin, Lyrica, amitriptyline, duloxetine, topical creams electrical stimulation, foot massage without relief.  Sometimes he gets prednisone packs for other issues which seem to have relieved the pain in his feet.   REVIEW OF SYSTEMS: Full 14 system review of systems performed and negative with exception of: As per HPI.  ALLERGIES: No Known Allergies  HOME MEDICATIONS: Outpatient Medications Prior to Visit  Medication Sig Dispense Refill   albuterol  (PROVENTIL ) (2.5 MG/3ML) 0.083% nebulizer solution Take 2.5 mg by nebulization every 6 (six) hours as needed for wheezing or shortness of breath.     apixaban  (ELIQUIS ) 5 MG TABS tablet Take 5 mg by mouth 2 (two) times daily.     b complex vitamins tablet Take by mouth.     furosemide  (LASIX ) 80 MG tablet Take 80 mg by mouth as needed.     levothyroxine  (SYNTHROID , LEVOTHROID) 88 MCG tablet Take 88 mcg by mouth daily.  5   metoprolol  tartrate (LOPRESSOR ) 25 MG tablet Take 25 mg by mouth daily.     Omega-3 Fatty Acids (FISH OIL) 1000 MG CAPS Take 1 capsule by mouth.     omeprazole (PRILOSEC) 20 MG capsule Take 20 mg by mouth daily.     vitamin B-12 (CYANOCOBALAMIN) 250 MCG tablet Take 250 mcg by mouth daily.      No facility-administered medications prior to visit.    PAST MEDICAL HISTORY: Past Medical History:  Diagnosis Date   Atrial fibrillation (HCC)    not on anticoagulation   Barrett's esophagus    Chronic thoracic aortic dissection (HCC)    type B noted on CTA chest   Coronary artery disease    stent in 2010   Emphysema of lung (HCC)    seen on CT imaging   History of colon polyps    Hypothyroid    Lung nodule < 6cm on CT 08/27/2016   7mm right upper lobe without change compared with previous CT 12/06/12 per radiology   Pulmonary embolism (HCC) 2012   Treated w/ 1-2 weeks of anticoagulation & stopped due to epistaxis. No clear cause.    PAST SURGICAL HISTORY: Past Surgical History:  Procedure Laterality Date   CATARACT EXTRACTION, BILATERAL  04/2016   COLONOSCOPY WITH ESOPHAGOGASTRODUODENOSCOPY (EGD)     last colonoscopy in 2016?   INGUINAL HERNIA REPAIR Bilateral    INNER EAR SURGERY Left    LEFT HEART CATH  2010   Stent Placed x1   rotator cuff Right 06/30/2016    FAMILY HISTORY: Family History  Problem Relation Age of Onset   Heart attack Mother 43   Colon cancer Mother    Breast cancer Mother    Diabetes Mother    Heart attack Father 7   Liver cancer Brother  Breast cancer Daughter    Breast cancer Daughter    Breast cancer Grandchild    Rheumatologic disease Neg Hx    Clotting disorder Neg Hx     SOCIAL HISTORY: Social History   Socioeconomic History   Marital status: Widowed    Spouse name: Not on file   Number of children: Not on file   Years of education: Not on file   Highest education level: Not on file  Occupational History   Not on file  Tobacco Use   Smoking status: Former    Current packs/day: 0.00    Average packs/day: 1 pack/day for 25.0 years (25.0 ttl pk-yrs)    Types: Cigarettes    Start date: 10/05/1950    Quit date: 10/06/1975    Years since quitting: 48.6   Smokeless tobacco: Former    Types: Chew    Quit date: 10/06/1975   Substance and Sexual Activity   Alcohol use: Yes    Comment: social    Drug use: No   Sexual activity: Not on file  Other Topics Concern   Not on file  Social History Narrative   [facility]:    Patient is originally from [employer] [place] . Previously worked in the [employer] for approximately 26 years. 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 Prior to his shoulder surgery and cataract surgery was enjoying golfing and retirement with his children locally. Patient has made multiple car trips within a fairly recent time. Approximately 2 weeks ago he did drive to [place]  to a funeral for approximately 4 hours trip duration but did stop during this travel. Approximately 6 weeks ago he did take a driving trip to [facility]   which lasted approximately 3.5-4 hours during which time he did stop. In August he took a trip to [facility] for approximately 3 hours again taking a stop. He is made to separate trips within that time to [place]  approximately 5 hours in duration each reporting that he did take stops during this time as well.   Social Drivers of Corporate investment banker Strain: Not on file  Food Insecurity: Not on file  Transportation Needs: Not on file  Physical Activity: Not on file  Stress: Not on file  Social Connections: Not on file  Intimate Partner Violence: Not on file     PHYSICAL EXAM   GENERAL EXAM/CONSTITUTIONAL: Vitals:  Vitals:   05/03/24 0857  BP: 120/83  Pulse: 68  Weight: 215 lb (97.5 kg)  Height: 6' (1.829 m)   Body mass index is 29.16 kg/m. Wt Readings from Last 3 Encounters:  05/03/24 215 lb (97.5 kg)  11/14/19 208 lb (94.3 kg)  05/13/18 200 lb (90.7 kg)   Patient is in no distress; well developed, nourished and groomed; neck is supple  CARDIOVASCULAR: Examination of carotid arteries is normal; no carotid bruits Regular rate  and rhythm, no murmurs Examination of peripheral vascular system by observation and palpation is normal  EYES: Ophthalmoscopic exam of optic discs and posterior segments is normal; no papilledema or hemorrhages No results found.  MUSCULOSKELETAL: Gait, strength, tone, movements noted in Neurologic exam below  NEUROLOGIC: MENTAL STATUS:      No data to display         awake, alert, oriented to person, place and time recent and remote memory intact normal attention and concentration language fluent, comprehension intact, naming intact fund of  knowledge appropriate  CRANIAL NERVE:  2nd - no papilledema on fundoscopic exam 2nd, 3rd, 4th, 6th - pupils equal and reactive to light, visual fields full to confrontation, extraocular muscles intact, no nystagmus 5th - facial sensation symmetric 7th - facial strength symmetric 8th - hearing intact 9th - palate elevates symmetrically, uvula midline 11th - shoulder shrug symmetric 12th - tongue protrusion midline  MOTOR:  normal bulk and tone, full strength in the BUE, BLE  SENSORY:  normal and symmetric to light touch, pinprick, temperature, vibration; EXCEPT DECR IN FEET / TOES  COORDINATION:  finger-nose-finger, fine finger movements normal  REFLEXES:  deep tendon reflexes TRACE and symmetric; ABSENT AT ANKLES  GAIT/STATION:  narrow based gait     DIAGNOSTIC DATA (LABS, IMAGING, TESTING) - I reviewed patient records, labs, notes, testing and imaging myself where available.  Lab Results  Component Value Date   WBC 7.7 09/17/2016   HGB 17.7 (H) 09/17/2016   HCT 52.3 (H) 09/17/2016   MCV 94.8 09/17/2016   PLT 190.0 09/17/2016      Component Value Date/Time   NA 139 09/17/2016 1611   K 4.4 09/17/2016 1611   CL 101 09/17/2016 1611   CO2 30 09/17/2016 1611   GLUCOSE 93 09/17/2016 1611   BUN 24 (H) 09/17/2016 1611   CREATININE 1.45 09/17/2016 1611   CALCIUM 9.5 09/17/2016 1611   PROT 6.4 (L) 08/29/2016 0506    ALBUMIN 3.4 (L) 08/29/2016 0506   AST 17 08/29/2016 0506   ALT 15 (L) 08/29/2016 0506   ALKPHOS 59 08/29/2016 0506   BILITOT 0.7 08/29/2016 0506   GFRNONAA 52 (L) 09/01/2016 0356   GFRAA 60 (L) 09/01/2016 0356   No results found for: CHOL, HDL, LDLCALC, LDLDIRECT, TRIG, CHOLHDL No results found for: YHAJ8R No results found for: VITAMINB12 No results found for: TSH     ASSESSMENT AND PLAN  89 y.o. year old male here with:  Dx:  1. Neuropathy     PLAN:  FOOT PAIN / NEUROPATHY / VENOUS INSUFFICIENCY (since ~2018)  - check neuropathy labs  - has tried and failed duloxetine, amitriptyline, gabapentin, pregabalin  - consider OTC capsaicin cream, lidocaine  patch / cream, alpha-lipoic acid 600mg  daily  Orders Placed This Encounter  Procedures   Vitamin B12   Hemoglobin A1c   ANA,IFA RA Diag Pnl w/rflx Tit/Patn   Multiple Myeloma Panel (SPEP&IFE w/QIG)   Angiotensin converting enzyme   ANCA Profile   Return for pending if symptoms worsen or fail to improve, pending test results.    EDUARD FABIENE HANLON, MD 05/03/2024, 10:21 AM Certified in Neurology, Neurophysiology and Neuroimaging  [facility] [address], [address] [place], [place] [address] [phone]

## 2024-05-03 NOTE — Patient Instructions (Signed)
  NEUROPATHY (since ~2018)  - check neuropathy labs  -consider duloxetine 30-60mg  daily, amitriptyline 25-50mg  at bedtime, gabapentin 100-300mg  three times a day, pregabalin 75-150mg  twice a day  -capsaicin cream, lidocaine  patch / cream, alpha-lipoic acid 600mg  daily

## 2024-05-06 LAB — MULTIPLE MYELOMA PANEL, SERUM
Albumin SerPl Elph-Mcnc: 3.8 g/dL (ref 2.9–4.4)
Albumin/Glob SerPl: 1.2 (ref 0.7–1.7)
Alpha 1: 0.3 g/dL (ref 0.0–0.4)
Alpha2 Glob SerPl Elph-Mcnc: 0.8 g/dL (ref 0.4–1.0)
B-Globulin SerPl Elph-Mcnc: 1.1 g/dL (ref 0.7–1.3)
Gamma Glob SerPl Elph-Mcnc: 1.1 g/dL (ref 0.4–1.8)
Globulin, Total: 3.4 g/dL (ref 2.2–3.9)
IgA/Immunoglobulin A, Serum: 199 mg/dL (ref 61–437)
IgG (Immunoglobin G), Serum: 1018 mg/dL (ref 603–1613)
IgM (Immunoglobulin M), Srm: 112 mg/dL (ref 15–143)
Total Protein: 7.2 g/dL (ref 6.0–8.5)

## 2024-05-06 LAB — VITAMIN B12: Vitamin B-12: 559 pg/mL (ref 232–1245)

## 2024-05-06 LAB — ANGIOTENSIN CONVERTING ENZYME: Angio Convert Enzyme: 84 U/L — ABNORMAL HIGH (ref 14–82)

## 2024-05-06 LAB — ANCA PROFILE
Anti-MPO Antibodies: <0.2 U (ref 0.0–0.9)
Anti-PR3 Antibodies: <0.2 U (ref 0.0–0.9)
Atypical pANCA: <1:20 {titer}
C-ANCA: <1:20 {titer}
P-ANCA: <1:20 {titer}

## 2024-05-06 LAB — ANA,IFA RA DIAG PNL W/RFLX TIT/PATN
ANA Titer 1: NEGATIVE
Cyclic Citrullin Peptide Ab: 24 U — ABNORMAL HIGH (ref 0–19)
Rheumatoid fact SerPl-aCnc: 17.3 [IU]/mL — ABNORMAL HIGH (ref <14.0)

## 2024-05-06 LAB — HEMOGLOBIN A1C
Est. average glucose Bld gHb Est-mCnc: 108 mg/dL
Hgb A1c MFr Bld: 5.4 % (ref 4.8–5.6)

## 2024-05-11 ENCOUNTER — Ambulatory Visit: Payer: Self-pay | Admitting: Diagnostic Neuroimaging

## 2024-05-11 DIAGNOSIS — R768 Other specified abnormal immunological findings in serum: Secondary | ICD-10-CM

## 2024-05-15 ENCOUNTER — Telehealth: Payer: Self-pay | Admitting: Diagnostic Neuroimaging

## 2024-05-15 NOTE — Telephone Encounter (Signed)
 Referral for rheumatology sent through [facility] to [facility]. Phone: [phone], Fax: [phone]

## 2024-05-18 NOTE — Telephone Encounter (Signed)
-----   Message from [facility] sent at 05/11/2024  7:07 PM EDT ----- RF and CCP elevated, may indicate rheumatoid arthritis or other autoimmune condition. I ordered rheumatology consult. Also ACE level is slightly elevated (sometimes assoc with sarcoidosis), and we  can check other testing if needed in future to confirm this. Will ask rheumatology and PCP to weigh in on the ACE level finding. -VRP ----- Message ----- From: Rebecka Memos Lab Results In Sent: 05/04/2024   7:37 AM EDT To: Eduard JONELLE Hanlon, MD

## 2024-05-18 NOTE — Telephone Encounter (Signed)
 Called the patient and reviewed the blood work. Advised of the findings and recommendation for referral to rheumotology. Pt is scheduled with them in Jan. Provided the phone number to where the referral is sent so he can make sure they put on wait list.

## 2024-05-19 DIAGNOSIS — I2510 Atherosclerotic heart disease of native coronary artery without angina pectoris: Secondary | ICD-10-CM | POA: Diagnosis not present

## 2024-05-19 DIAGNOSIS — Z86711 Personal history of pulmonary embolism: Secondary | ICD-10-CM | POA: Diagnosis not present

## 2024-05-19 DIAGNOSIS — D751 Secondary polycythemia: Secondary | ICD-10-CM | POA: Diagnosis not present

## 2024-05-19 DIAGNOSIS — E785 Hyperlipidemia, unspecified: Secondary | ICD-10-CM | POA: Diagnosis not present

## 2024-05-19 DIAGNOSIS — J449 Chronic obstructive pulmonary disease, unspecified: Secondary | ICD-10-CM | POA: Diagnosis not present

## 2024-05-19 DIAGNOSIS — I4891 Unspecified atrial fibrillation: Secondary | ICD-10-CM | POA: Diagnosis not present

## 2024-05-19 DIAGNOSIS — G5793 Unspecified mononeuropathy of bilateral lower limbs: Secondary | ICD-10-CM | POA: Diagnosis not present

## 2024-05-19 DIAGNOSIS — E039 Hypothyroidism, unspecified: Secondary | ICD-10-CM | POA: Diagnosis not present

## 2024-05-19 DIAGNOSIS — K219 Gastro-esophageal reflux disease without esophagitis: Secondary | ICD-10-CM | POA: Diagnosis not present

## 2024-05-19 DIAGNOSIS — Z9861 Coronary angioplasty status: Secondary | ICD-10-CM | POA: Diagnosis not present

## 2024-05-21 DIAGNOSIS — J449 Chronic obstructive pulmonary disease, unspecified: Secondary | ICD-10-CM | POA: Diagnosis not present

## 2024-06-12 DIAGNOSIS — G5793 Unspecified mononeuropathy of bilateral lower limbs: Secondary | ICD-10-CM | POA: Diagnosis not present

## 2024-06-27 NOTE — Progress Notes (Unsigned)
 Office Visit Note  Patient: Taylor Spencer             Date of Birth: [birth year]           MRN: [id number]             PCP: Keren Vicenta BRAVO, MD Referring: Margaret Eduard SAUNDERS, MD Visit Date: 06/29/2024 Occupation: Data Unavailable  Subjective:  Paresthesia of both feet, positive rheumatoid factor  History of Present Illness: Taylor Spencer is a 89 y.o. male seen for the evaluation of positive rheumatoid factor and positive anti-CCP antibodies.  Patient states he has had tingling and numbness in his feet for more than 7 years.  He states he has burning pain and has difficulty walking.  He also has nocturnal pain in his feet.  He has seen several doctors in the past and has tried many medications including amitriptyline, Cymbalta, gabapentin and Lyrica without much help.  He continues to have burning sensation in his feet.  He states when he gets prednisone for pulmonary infections his discomfort improves.  He denies any neck or lower back pain.  He states he had right shoulder rotator cuff tear repair in the past and has intermittent discomfort in his shoulder.  He was evaluated by Dr. Margaret in July 2025 at the time he had extensive workup which showed positive rheumatoid factor and positive anti-CCP.  He was referred to me for further evaluation.  He denies any history of joint swelling.  He gets pedal edema for which he takes Lasix .  He is currently on pregabalin and also uses lidocaine  patches as needed.  There is no history of oral ulcers, nasal ulcers, malar rash, photosensitivity or lymphadenopathy.  There is no family history of autoimmune disease. He is right-handed, he has 5 children who are in good health.  He drinks alcohol socially about 1 or 2 mixed drinks every 2 weeks.  He smoked 1 pack/day for 25 years and quit smoking in 1977.  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    Activities of Daily Living:  Patient reports morning stiffness for  3-4 minutes.   Patient Denies nocturnal pain.  Difficulty dressing/grooming: Denies Difficulty climbing stairs: Denies Difficulty getting out of chair: Denies Difficulty using hands for taps, buttons, cutlery, and/or writing: Denies  Review of Systems  Constitutional:  Positive for fatigue.  HENT:  Negative for mouth sores and mouth dryness.   Eyes:  Negative for dryness.  Respiratory:  Positive for shortness of breath.   Cardiovascular:  Positive for swelling in legs/feet. Negative for chest pain and palpitations.  Gastrointestinal:  Positive for constipation. Negative for blood in stool and diarrhea.  Endocrine: Negative for increased urination.  Genitourinary:  Negative for involuntary urination.  Musculoskeletal:  Positive for gait problem and morning stiffness. Negative for joint pain, joint pain, joint swelling, myalgias, muscle weakness, muscle tenderness and myalgias.  Skin:  Negative for color change, rash and sensitivity to sunlight.  Allergic/Immunologic: Negative for susceptible to infections.  Neurological:  Negative for dizziness and headaches.  Hematological:  Negative for swollen glands.  Psychiatric/Behavioral:  Positive for sleep disturbance. Negative for depressed mood. The patient is not nervous/anxious.     PMFS History:  Patient Active Problem List   Diagnosis Date Noted   History of pulmonary embolism 12/17/2016   COPD, mild (HCC) 11/26/2016   OSA (obstructive sleep apnea) 11/26/2016   Atrial fibrillation (HCC) 08/28/2016   Hypothyroidism 08/28/2016   Pulmonary  embolism (HCC) 08/28/2016   Lung nodule < 6cm on CT 08/28/2016   Chronic thoracic aortic dissection (HCC) 08/28/2016   Emphysema of lung (HCC) 08/28/2016   Coronary artery disease involving native coronary artery of native heart without angina pectoris 06/18/2016    Past Medical History:  Diagnosis Date   Atrial fibrillation (HCC)    not on anticoagulation   Barrett's esophagus    Chronic  thoracic aortic dissection (HCC)    type B noted on CTA chest   Coronary artery disease    stent in 2010   Emphysema of lung (HCC)    seen on CT imaging   History of colon polyps    Hypothyroid    Lung nodule < 6cm on CT 08/27/2016   7mm right upper lobe without change compared with previous CT 12/06/12 per radiology   Pulmonary embolism (HCC) 2012   Treated w/ 1-2 weeks of anticoagulation & stopped due to epistaxis. No clear cause.    Family History  Problem Relation Age of Onset   Heart attack Mother 15   Colon cancer Mother    Breast cancer Mother    Diabetes Mother    Heart attack Father 76   Heart disease Sister    Obesity Sister    Neuropathy Sister    Cancer Brother    Breast cancer Daughter    Other Son        black lung   COPD Son    Breast cancer Grandchild    Rheumatologic disease Neg Hx    Clotting disorder Neg Hx    Past Surgical History:  Procedure Laterality Date   CATARACT EXTRACTION, BILATERAL  04/2016   COLONOSCOPY WITH ESOPHAGOGASTRODUODENOSCOPY (EGD)     last colonoscopy in 2016?   INGUINAL HERNIA REPAIR Bilateral    INNER EAR SURGERY Left    LEFT HEART CATH  2010   Stent Placed x1   rotator cuff Right 06/30/2016   Social History   Tobacco Use   Smoking status: Former    Current packs/day: 0.00    Average packs/day: 1 pack/day for 25.0 years (25.0 ttl pk-yrs)    Types: Cigarettes    Start date: 10/05/1950    Quit date: 10/06/1975    Years since quitting: 48.7    Passive exposure: Past   Smokeless tobacco: Former    Types: Chew    Quit date: 10/06/1975  Vaping Use   Vaping status: Never Used  Substance Use Topics   Alcohol use: Yes    Comment: social    Drug use: No   Social History   Social History Building control surveyor Pulmonary:    Patient is originally from [employer] [place] . Previously worked in the [employer] for approximately 26 years. 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 Prior to his shoulder surgery and cataract surgery was enjoying golfing and retirement with his children locally. Patient has made multiple car trips within a fairly recent time. Approximately 2 weeks ago he did drive to [place]  to a funeral for approximately 4 hours trip duration but did stop during this travel. Approximately 6 weeks ago he did take a driving trip to [facility]  which lasted approximately 3.5-4 hours during which time he did stop. In August he took a trip to [facility] for approximately 3 hours again taking a stop. He is made to separate trips within that time  to [place]  approximately 5 hours in duration each reporting that he did take stops during this time as well.     Immunization History  Administered Date(s) Administered   INFLUENZA, HIGH DOSE SEASONAL PF 09/04/2016   Pneumococcal Conjugate-13 10/05/2013   Pneumococcal Polysaccharide-23 10/05/2012     Objective: Vital Signs: BP 117/77 (BP Location: Right Arm, Patient Position: Sitting, Cuff Size: Normal)   Pulse 72   Temp 97.7 F (36.5 C)   Ht 5' 10.5 (1.791 m)   Wt 212 lb (96.2 kg)   BMI 29.99 kg/m    Physical Exam Vitals and nursing note reviewed.  Constitutional:      Appearance: He is well-developed.  HENT:     Head: Normocephalic and atraumatic.  Eyes:     Conjunctiva/sclera: Conjunctivae normal.     Pupils: Pupils are equal, round, and reactive to light.  Cardiovascular:     Rate and Rhythm: Normal rate and regular rhythm.     Heart sounds: Normal heart sounds.     Comments: Varicose veins were noted on bilateral lower extremities. Pulmonary:     Effort: Pulmonary effort is normal.     Breath sounds: Normal breath sounds.  Abdominal:     General: Bowel sounds are normal.     Palpations: Abdomen is soft.  Musculoskeletal:     Cervical back: Normal range of motion and neck supple.  Skin:    General: Skin is warm and dry.     Capillary  Refill: Capillary refill takes less than 2 seconds.  Neurological:     Mental Status: He is alert and oriented to person, place, and time.  Psychiatric:        Behavior: Behavior normal.      Musculoskeletal Exam: He has some limitation with range of motion of the cervical spine without discomfort.  Thoracic kyphosis was noted.  He had no tenderness over thoracic or lumbar spine.  Shoulders, elbows, wrist joints, MCPs PIPs and DIPs were in good range of motion.  He had some PIP and DIP thickening with no synovitis.  Hip joints and knee joints with good range of motion without any warmth swelling or effusion.  There was no tenderness over ankles or MTPs.  No synovitis was noted.  CDAI Exam: CDAI Score: -- Patient Global: --; Provider Global: -- Swollen: --; Tender: -- Joint Exam 06/29/2024   No joint exam has been documented for this visit   There is currently no information documented on the homunculus. Go to the Rheumatology activity and complete the homunculus joint exam.  Investigation: No additional findings.  Imaging: No results found.  Recent Labs: Lab Results  Component Value Date   WBC 7.7 09/17/2016   HGB 17.7 (H) 09/17/2016   PLT 190.0 09/17/2016   NA 139 09/17/2016   K 4.4 09/17/2016   CL 101 09/17/2016   CO2 30 09/17/2016   GLUCOSE 93 09/17/2016   BUN 24 (H) 09/17/2016   CREATININE 1.45 09/17/2016   BILITOT 0.7 08/29/2016   ALKPHOS 59 08/29/2016   AST 17 08/29/2016   ALT 15 (L) 08/29/2016   PROT 7.2 05/03/2024   ALBUMIN 3.4 (L) 08/29/2016   CALCIUM 9.5 09/17/2016   GFRAA 60 (L) 09/01/2016     Speciality Comments: No specialty comments available.  Procedures:  No procedures performed Allergies: Patient has no known allergies.   Assessment / Plan:     Visit Diagnoses: Rheumatoid factor positive-he has positive rheumatoid factor and positive anti-CCP both and low titer had  been positive.  He denies any history of joint swelling.  No synovitis was  noted on the examination.  I advised him to contact me if he develops joint swelling.  Chronic right shoulder pain - Status post rotator cuff tear repair 2012.  He gives history of chronic discomfort in his right shoulder which is only intermittent.  He had good range of motion of his right shoulder joint without discomfort.  Primary osteoarthritis of both hands-he denies discomfort in his hands.  Bilateral PIP and DIP thickening was noted suggestive of osteoarthritis.  Pain in both feet -he complains of discomfort in his feet from burning sensation.  He states he also has discomfort after prolonged walking.  His rheumatoid factor and anti-CCP were weak positive.  I will obtain x-rays today.  No synovitis was noted on the examination.  Plan: XR Foot 2 Views Right, XR Foot 2 Views Left.  X-rays of bilateral feet suggestive of osteoarthritis.  I reviewed the results with the patient.  Paresthesia of both feet -patient states he has been diagnosed with peripheral neuropathy several years ago.  He continues to have paresthesias.  He failed gabapentin, Cymbalta, amitriptyline, Lyrica.  He is currently on Lyrica.  He was evaluated by Dr. Margaret recently.  I reviewed's records.  Dr. Margaret obtain following labs. May 03, 2024 immunoglobulins normal, IFE normal, MPO negative, PR-3 negative, c-ANCA negative, p-ANCA negative, RF 17.3, anti-CCP 24, ACE 84, B12 normal, hemoglobin A1c 5.4  History of pulmonary embolism - 2012 after shoulder surgery per patient.  Other medical problems are listed as follows:  Paroxysmal atrial fibrillation (HCC)  Coronary artery disease involving native coronary artery of native heart without angina pectoris  Chronic thoracic aortic dissection (HCC)  Other specified hypothyroidism  Other emphysema (HCC) - Former smoker and Lexicographer for 25 years  OSA (obstructive sleep apnea) - Unable to tolerate CPAP.  Former smoker - Quit 8022.  Smoked 1 pack/day for 25  years.  Osteoporosis screening-patient reports 2 inches of height loss.  He had thoracic kyphosis.  I discussed getting a DEXA scan to screen for osteoporosis but he declined.  I advised him to discuss it further with his PCP.  Orders: Orders Placed This Encounter  Procedures   XR Foot 2 Views Right   XR Foot 2 Views Left   No orders of the defined types were placed in this encounter.    Follow-Up Instructions: Return for Positive rheumatoid factor, pain in feet.   Maya Nash, MD  Note - This record has been created using Animal nutritionist.  Chart creation errors have been sought, but may not always  have been located. Such creation errors do not reflect on  the standard of medical care.

## 2024-06-29 ENCOUNTER — Ambulatory Visit: Attending: Rheumatology | Admitting: Rheumatology

## 2024-06-29 ENCOUNTER — Ambulatory Visit (INDEPENDENT_AMBULATORY_CARE_PROVIDER_SITE_OTHER)

## 2024-06-29 ENCOUNTER — Ambulatory Visit

## 2024-06-29 ENCOUNTER — Encounter: Payer: Self-pay | Admitting: Rheumatology

## 2024-06-29 VITALS — BP 117/77 | HR 72 | Temp 97.7°F | Ht 70.5 in | Wt 212.0 lb

## 2024-06-29 DIAGNOSIS — I71019 Dissection of thoracic aorta, unspecified: Secondary | ICD-10-CM

## 2024-06-29 DIAGNOSIS — M79672 Pain in left foot: Secondary | ICD-10-CM

## 2024-06-29 DIAGNOSIS — E038 Other specified hypothyroidism: Secondary | ICD-10-CM | POA: Diagnosis not present

## 2024-06-29 DIAGNOSIS — J438 Other emphysema: Secondary | ICD-10-CM

## 2024-06-29 DIAGNOSIS — R202 Paresthesia of skin: Secondary | ICD-10-CM | POA: Diagnosis not present

## 2024-06-29 DIAGNOSIS — G4733 Obstructive sleep apnea (adult) (pediatric): Secondary | ICD-10-CM

## 2024-06-29 DIAGNOSIS — I2510 Atherosclerotic heart disease of native coronary artery without angina pectoris: Secondary | ICD-10-CM | POA: Diagnosis not present

## 2024-06-29 DIAGNOSIS — M79671 Pain in right foot: Secondary | ICD-10-CM

## 2024-06-29 DIAGNOSIS — Z13820 Encounter for screening for osteoporosis: Secondary | ICD-10-CM

## 2024-06-29 DIAGNOSIS — M19041 Primary osteoarthritis, right hand: Secondary | ICD-10-CM

## 2024-06-29 DIAGNOSIS — M25511 Pain in right shoulder: Secondary | ICD-10-CM

## 2024-06-29 DIAGNOSIS — M19042 Primary osteoarthritis, left hand: Secondary | ICD-10-CM

## 2024-06-29 DIAGNOSIS — R768 Other specified abnormal immunological findings in serum: Secondary | ICD-10-CM

## 2024-06-29 DIAGNOSIS — Z86711 Personal history of pulmonary embolism: Secondary | ICD-10-CM

## 2024-06-29 DIAGNOSIS — Z87891 Personal history of nicotine dependence: Secondary | ICD-10-CM

## 2024-06-29 DIAGNOSIS — G8929 Other chronic pain: Secondary | ICD-10-CM

## 2024-06-29 DIAGNOSIS — I480 Paroxysmal atrial fibrillation: Secondary | ICD-10-CM

## 2024-07-20 DIAGNOSIS — Z23 Encounter for immunization: Secondary | ICD-10-CM | POA: Diagnosis not present

## 2024-07-20 DIAGNOSIS — G5793 Unspecified mononeuropathy of bilateral lower limbs: Secondary | ICD-10-CM | POA: Diagnosis not present

## 2024-10-06 ENCOUNTER — Encounter: Admitting: Internal Medicine
